# Patient Record
Sex: Male | Born: 1975
Health system: Southern US, Community
[De-identification: ages and names within clinical notes are randomized; demographics above are authoritative.]

## PROBLEM LIST (undated history)

## (undated) DIAGNOSIS — Z9889 Other specified postprocedural states: Secondary | ICD-10-CM

## (undated) DIAGNOSIS — R112 Nausea with vomiting, unspecified: Secondary | ICD-10-CM

## (undated) DIAGNOSIS — R0683 Snoring: Secondary | ICD-10-CM

## (undated) DIAGNOSIS — J329 Chronic sinusitis, unspecified: Secondary | ICD-10-CM

## (undated) DIAGNOSIS — F419 Anxiety disorder, unspecified: Secondary | ICD-10-CM

## (undated) DIAGNOSIS — I10 Essential (primary) hypertension: Secondary | ICD-10-CM

## (undated) DIAGNOSIS — T8859XA Other complications of anesthesia, initial encounter: Secondary | ICD-10-CM

## (undated) HISTORY — DX: Snoring: R06.83

---

## 2003-07-21 ENCOUNTER — Ambulatory Visit (HOSPITAL_COMMUNITY): Admission: RE | Admit: 2003-07-21 | Discharge: 2003-07-21 | Payer: Self-pay | Admitting: Pulmonary Disease

## 2006-02-01 ENCOUNTER — Ambulatory Visit (HOSPITAL_COMMUNITY): Admission: RE | Admit: 2006-02-01 | Discharge: 2006-02-01 | Payer: Self-pay | Admitting: Urology

## 2006-02-11 ENCOUNTER — Emergency Department (HOSPITAL_COMMUNITY): Admission: EM | Admit: 2006-02-11 | Discharge: 2006-02-11 | Payer: Self-pay | Admitting: Emergency Medicine

## 2006-11-06 HISTORY — PX: SHOULDER SURGERY: SHX246

## 2007-07-02 ENCOUNTER — Ambulatory Visit (HOSPITAL_COMMUNITY): Admission: RE | Admit: 2007-07-02 | Discharge: 2007-07-02 | Payer: Self-pay | Admitting: Family Medicine

## 2007-11-29 ENCOUNTER — Ambulatory Visit (HOSPITAL_COMMUNITY): Admission: RE | Admit: 2007-11-29 | Discharge: 2007-11-29 | Payer: Self-pay | Admitting: Specialist

## 2008-01-24 ENCOUNTER — Ambulatory Visit (HOSPITAL_COMMUNITY): Admission: RE | Admit: 2008-01-24 | Discharge: 2008-01-24 | Payer: Self-pay | Admitting: Family Medicine

## 2008-11-02 ENCOUNTER — Ambulatory Visit (HOSPITAL_COMMUNITY): Admission: RE | Admit: 2008-11-02 | Discharge: 2008-11-02 | Payer: Self-pay | Admitting: Family Medicine

## 2009-11-13 ENCOUNTER — Emergency Department (HOSPITAL_COMMUNITY): Admission: EM | Admit: 2009-11-13 | Discharge: 2009-11-13 | Payer: Self-pay | Admitting: Emergency Medicine

## 2011-01-22 LAB — URINE MICROSCOPIC-ADD ON

## 2011-01-22 LAB — URINALYSIS, ROUTINE W REFLEX MICROSCOPIC
Bilirubin Urine: NEGATIVE
Glucose, UA: NEGATIVE mg/dL
Ketones, ur: NEGATIVE mg/dL
pH: 7 (ref 5.0–8.0)

## 2011-09-16 ENCOUNTER — Encounter: Payer: Self-pay | Admitting: *Deleted

## 2011-09-16 ENCOUNTER — Emergency Department (HOSPITAL_COMMUNITY)
Admission: EM | Admit: 2011-09-16 | Discharge: 2011-09-16 | Disposition: A | Discharge status: Home / Self Care | Payer: BC Managed Care – PPO | Attending: Emergency Medicine | Admitting: Emergency Medicine

## 2011-09-16 ENCOUNTER — Emergency Department (HOSPITAL_COMMUNITY): Payer: BC Managed Care – PPO

## 2011-09-16 DIAGNOSIS — IMO0002 Reserved for concepts with insufficient information to code with codable children: Secondary | ICD-10-CM | POA: Insufficient documentation

## 2011-09-16 DIAGNOSIS — M79609 Pain in unspecified limb: Secondary | ICD-10-CM | POA: Insufficient documentation

## 2011-09-16 DIAGNOSIS — W298XXA Contact with other powered powered hand tools and household machinery, initial encounter: Secondary | ICD-10-CM | POA: Insufficient documentation

## 2011-09-16 DIAGNOSIS — R609 Edema, unspecified: Secondary | ICD-10-CM | POA: Insufficient documentation

## 2011-09-16 DIAGNOSIS — S62619B Displaced fracture of proximal phalanx of unspecified finger, initial encounter for open fracture: Secondary | ICD-10-CM

## 2011-09-16 MED ORDER — OXYCODONE-ACETAMINOPHEN 5-325 MG PO TABS: 1.0000 | ORAL_TABLET | Freq: Four times a day (QID) | ORAL | Status: AC | PRN

## 2011-09-16 MED ORDER — CEPHALEXIN 500 MG PO CAPS: 500.0000 mg | ORAL_CAPSULE | Freq: Four times a day (QID) | ORAL | Status: AC

## 2011-09-16 MED ORDER — OXYCODONE-ACETAMINOPHEN 5-325 MG PO TABS
1.0000 | ORAL_TABLET | Freq: Once | ORAL | Status: AC
  Administered 2011-09-16: 1 via ORAL
  Filled 2011-09-16: qty 1

## 2011-09-16 MED ORDER — CEFAZOLIN SODIUM 1 G IJ SOLR
1.0000 g | Freq: Once | INTRAMUSCULAR | Status: AC
  Administered 2011-09-16: 1 g via INTRAMUSCULAR
  Filled 2011-09-16: qty 10

## 2011-09-16 MED ORDER — TETANUS-DIPHTH-ACELL PERTUSSIS 5-2.5-18.5 LF-MCG/0.5 IM SUSP
0.5000 mL | Freq: Once | INTRAMUSCULAR | Status: AC
  Administered 2011-09-16: 0.5 mL via INTRAMUSCULAR
  Filled 2011-09-16: qty 0.5

## 2011-09-16 NOTE — ED Notes (Signed)
No adverse reaction to injection site.

## 2011-09-16 NOTE — ED Provider Notes (Signed)
History     CSN: [id number] Arrival date & time: 09/16/2011 12:35 PM   First MD Initiated Contact with Patient 09/16/11 1315      Chief Complaint  Patient presents with  . Hand Injury    (Consider location/radiation/quality/duration/timing/severity/associated sxs/prior treatment) HPI Pt had a nail gun accidentally fire into his palm around the 5th mcp and exited the proximal phalanx aspect of his small finger on the left at 11am.  Pt has pain in the finger.  Pain increases with movement, it is severe. Ice helps somewhat.  Pt denies numbness or weakness.  No other injuries.   History reviewed. No pertinent past medical history.  Past Surgical History  Procedure Date  . Shoulder surgery     left    History reviewed. No pertinent family history.  History  Substance Use Topics  . Smoking status: Current Everyday Smoker -- 0.5 packs/day    Types: Cigarettes  . Smokeless tobacco: Not on file  . Alcohol Use: Yes     weekly      Review of Systems  All other systems reviewed and are negative.    Allergies  Review of patient's allergies indicates no known allergies.  Home Medications  No current outpatient prescriptions on file.  BP 166/113  Pulse 76  Temp(Src) 98.2 F (36.8 C) (Oral)  Resp 18  Ht 6\' 1"  (1.854 m)  Wt 210 lb (95.255 kg)  BMI 27.71 kg/m2  SpO2 99%  Physical Exam  Nursing note and vitals reviewed. Constitutional: He appears well-developed and well-nourished. No distress.  HENT:  Head: Normocephalic and atraumatic.  Right Ear: External ear normal.  Left Ear: External ear normal.  Eyes: Conjunctivae are normal. Right eye exhibits no discharge. Left eye exhibits no discharge. No scleral icterus.  Neck: Neck supple. No tracheal deviation present.  Cardiovascular: Normal rate.   Pulmonary/Chest: Effort normal. No stridor. No respiratory distress.  Musculoskeletal: He exhibits edema and tenderness.       Hands:      Patient with 2 small puncture  wounds as depicted, pain with range of motion, distal cap refill brisk, limited range of motion but patient is able to flex and extend his small finger  Neurological: He is alert. Cranial nerve deficit: no gross deficits.  Skin: Skin is warm and dry. No rash noted.  Psychiatric: He has a normal mood and affect.    ED Course  Procedures (including critical care time) Pt placed in splint by ED staff.  Tolerated well.   Medications      ceFAZolin (ANCEF) injection 1 g (not administered)  TDaP (BOOSTRIX) injection 0.5 mL (0.5 mL Intramuscular Given 09/16/11 1339)  oxyCODONE-acetaminophen (PERCOCET) 5-325 MG per tablet 1 tablet (1 tablet Oral Given 09/16/11 1330)    Labs Reviewed - No data to display Dg Hand Complete Left  09/16/2011  *RADIOLOGY REPORT*  Clinical Data: Puncture wound to third digit  LEFT HAND - COMPLETE 3+ VIEW  Comparison: None.  Findings: There is a fracture at the base of the proximal phalanx of the fifth digit.  No radiodense foreign body.  IMPRESSION: Fracture at the base of the proximal phalanx of the fifth digit.  Original Report Authenticated By: Genevive Bi, M.D.      MDM  Patient is a fracture at the base this proximal phalanx of the fifth digit. This is technically an open wound as this was caused by a nail gun injury. Patient has been given tetanus and antibiotics. I will start him on  oral antibiotics . I doubt he will require operative intervention at this time is it's a puncture wound however there is the potential for a deep space infection I will consult orthopedics   Diagnosis: Fracture proximal phalanx fifth finger, #2 puncture wound    3:22 PM discussed the case with Dr. Romeo Apple. He wants to see the patient in his office on Monday  Celene Kras, MD 09/16/11 [phone]

## 2011-09-16 NOTE — ED Notes (Signed)
Pt states that he shot a nail through his left hand with a nail gun approximately 45 minutes ago. No bleeding at present.

## 2011-09-19 ENCOUNTER — Encounter: Payer: Self-pay | Admitting: Orthopedic Surgery

## 2011-09-19 ENCOUNTER — Ambulatory Visit (INDEPENDENT_AMBULATORY_CARE_PROVIDER_SITE_OTHER): Payer: BC Managed Care – PPO | Admitting: Orthopedic Surgery

## 2011-09-19 ENCOUNTER — Ambulatory Visit (HOSPITAL_COMMUNITY)
Admission: RE | Admit: 2011-09-19 | Discharge: 2011-09-19 | Disposition: A | Discharge status: Home / Self Care | Payer: BC Managed Care – PPO | Source: Ambulatory Visit | Attending: Orthopedic Surgery | Admitting: Orthopedic Surgery

## 2011-09-19 VITALS — BP 130/90 | Ht 73.0 in | Wt 210.0 lb

## 2011-09-19 DIAGNOSIS — S62619A Displaced fracture of proximal phalanx of unspecified finger, initial encounter for closed fracture: Secondary | ICD-10-CM

## 2011-09-19 DIAGNOSIS — IMO0001 Reserved for inherently not codable concepts without codable children: Secondary | ICD-10-CM | POA: Insufficient documentation

## 2011-09-19 DIAGNOSIS — IMO0002 Reserved for concepts with insufficient information to code with codable children: Secondary | ICD-10-CM | POA: Insufficient documentation

## 2011-09-19 DIAGNOSIS — Z4689 Encounter for fitting and adjustment of other specified devices: Secondary | ICD-10-CM | POA: Insufficient documentation

## 2011-09-19 NOTE — Patient Instructions (Signed)
Finish those antibiotics

## 2011-09-19 NOTE — Progress Notes (Signed)
Occupational Therapy Evaluation  Patient Details  Name: Adrian Lawrence MRN: [id number] Date of Birth: 04-Nov-1976  Today's Date: 09/19/2011 Time: 1600-1630 Time Calculation (min): 30 min OT Eval 10' Splinting 20' Visit#: 1  of 1   Re-eval:    Assessment Diagnosis: S/P Left Prox Phalanx Fracture Next MD Visit: 09/26/11 Prior Therapy: none  Past Medical History: No past medical history on file. Past Surgical History:  Past Surgical History  Procedure Date  . Shoulder surgery     left    Subjective Symptoms/Limitations Symptoms: S:  I shot a nail through my little finger with a nail gun on Saturday. Pain Assessment Currently in Pain?: Yes Pain Score:   4 Pain Location: Hand Pain Orientation: Left Pain Type: Acute pain   Assessment LUE Assessment LUE Assessment:  (PROM is WFL in left ring and small digit)  Exercise/Treatments  SPLINTING:  Fabricated an ulnar gutter splint for left ring and small digits positioning wrist in neutral, MCPJ in 60 flexion, PIPJ and DIPJ in neutral.  Patient was educated on donning and doffing splint, contraindications, wearing scheduled and voiced understanding.  Occupational Therapy Assessment and Plan OT Assessment and Plan Clinical Impression Statement: A:  Patient presents with fx of left proximal phalanx.  Splint fabricated to keep PIPJ and DIPJ in neutral and MCPJ in 60 flexion. Rehab Potential: Excellent OT Frequency: Min 1X/week OT Duration: Other (comment) (1 week) OT Treatment/Interventions: Patient/family education (splinting) OT Plan: One time visit completed this date.  I will place a follow up phone call with patient on 09/22/11 to assess fit and compliance with use of splint.   Goals Short Term Goals Time to Complete Short Term Goals: Other (comment) (1 week) Short Term Goal 1: Patient will be I with wear and care of left hand ulnar gutter splint. End of Session Patient Active Problem List  Diagnoses  . Closed fracture  of middle or proximal phalanx or phalanges of hand   End of Session Activity Tolerance: Patient tolerated treatment well General Behavior During Session: [facility] for tasks performed Cognition: [facility] for tasks performed  Time Calculation Start Time: 1600 Stop Time: 1630 Time Calculation (min): 30 min  Shirlean Mylar, OTR/L  09/19/2011, 5:44 PM

## 2011-09-25 ENCOUNTER — Encounter: Payer: Self-pay | Admitting: Orthopedic Surgery

## 2011-09-25 NOTE — Progress Notes (Signed)
LEFT hand injury  35-year-old male was using a nail gun and the nail went through the volar aspect of his proximal phalanx exiting distal to the joint on November 10.  The pain now is sharp and throbbing 5/10 constant and associated with swelling and bruising he denies numbness tingling.  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  He denies other illnesses or complaints on his review of systems except for his musculoskeletal symptoms.  History reviewed. No pertinent past medical history.  Past Surgical History  Procedure Date  . Shoulder surgery     left    Review of systems joint pain and joint swelling  Exam shows normal appearance and orientation.  Mood and affect are normal.  Ambulation normal.  Examination the LEFT small finger there is tenderness and swelling 2 puncture wounds are noted there is decreased range of motion no joint alignment problems no finger alignment problem clinically normal.  Stability normal.  Strength could not be assessed scan as stated pulse normal color good lymph nodes negative sensation normal.  X-ray shows a fracture proximal phalanx normal alignment no major displacement or angulation  Impression proximal phalanx fracture LEFT hand small finger  Recommended splint from occupational therapy and then early range of motion.  Repeat x-ray

## 2011-09-26 ENCOUNTER — Telehealth (HOSPITAL_COMMUNITY): Payer: Self-pay | Admitting: Specialist

## 2011-09-26 NOTE — Telephone Encounter (Signed)
I contacted Mr. Pickar and left message regarding fit/compliance of the splint that was fabricated for patient last week.

## 2011-09-27 ENCOUNTER — Ambulatory Visit (INDEPENDENT_AMBULATORY_CARE_PROVIDER_SITE_OTHER): Payer: BC Managed Care – PPO | Admitting: Orthopedic Surgery

## 2011-09-27 ENCOUNTER — Encounter: Payer: Self-pay | Admitting: Orthopedic Surgery

## 2011-09-27 DIAGNOSIS — IMO0002 Reserved for concepts with insufficient information to code with codable children: Secondary | ICD-10-CM

## 2011-09-27 NOTE — Progress Notes (Signed)
Scheduled followup for x-rays  Previous history: 35 yo male was using a nail gun and the nail went through the volar aspect of his proximal phalanx exiting distal to the joint on November 10. The pain now is sharp and throbbing 5/10 constant and associated with swelling and bruising he denies numbness tingling. 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 He denies other illnesses or complaints on his review of systems except for his musculoskeletal symptoms.   No problems at this point no changes in the alignment of the finger clinically.  He can bend his finger.  He is wearing a night splint.  Separate x-ray report 3 views LEFT small finger  Clinical I. Ms. Normal on the proximal phalanx of the LEFT small finger.  There is some overlap on the lateral x-ray but the fracture is well visualized.  Impression healing LEFT small finger fracture proximal phalanx  Plan three-week x-ray continue night splint.  Patient cautioned to be very careful

## 2011-09-27 NOTE — Patient Instructions (Signed)
SPLINT AT NIGHT

## 2011-10-03 ENCOUNTER — Telehealth (HOSPITAL_COMMUNITY): Payer: Self-pay | Admitting: Specialist

## 2011-10-03 NOTE — Telephone Encounter (Signed)
Patient called and left message regarding exercises he could do.  I recommended any movement within splint as long as no increased pain or swelling occurred.  He stated the splint is fitting great.  No other questions or concerns.

## 2011-10-18 ENCOUNTER — Ambulatory Visit (INDEPENDENT_AMBULATORY_CARE_PROVIDER_SITE_OTHER): Payer: BC Managed Care – PPO | Admitting: Orthopedic Surgery

## 2011-10-18 ENCOUNTER — Encounter: Payer: Self-pay | Admitting: Orthopedic Surgery

## 2011-10-18 DIAGNOSIS — IMO0002 Reserved for concepts with insufficient information to code with codable children: Secondary | ICD-10-CM

## 2011-10-18 NOTE — Progress Notes (Signed)
  Left small finger proximal phalanx fracture followup doing well scheduled for x-rays x-rays show fracture healing alignment normal clinical alignment normal full range of motion  X-ray were 3 views left hand compared to previous x-ray reason for x-ray fracture The noted fracture has healed in good position alignment is normal  Impression healed left small finger fracture with normal alignment

## 2011-10-18 NOTE — Patient Instructions (Signed)
Normal activity    

## 2011-11-07 HISTORY — PX: LITHOTRIPSY: SUR834

## 2012-03-11 ENCOUNTER — Ambulatory Visit (HOSPITAL_COMMUNITY)
Admission: RE | Admit: 2012-03-11 | Discharge: 2012-03-11 | Disposition: A | Discharge status: Home / Self Care | Payer: BC Managed Care – PPO | Source: Ambulatory Visit | Attending: Urology | Admitting: Urology

## 2012-03-11 ENCOUNTER — Encounter (HOSPITAL_COMMUNITY)
Admission: RE | Disposition: A | Discharge status: Home / Self Care | Payer: Self-pay | Source: Ambulatory Visit | Attending: Urology

## 2012-03-11 ENCOUNTER — Encounter (HOSPITAL_COMMUNITY): Payer: Self-pay

## 2012-03-11 ENCOUNTER — Ambulatory Visit (HOSPITAL_COMMUNITY): Payer: BC Managed Care – PPO

## 2012-03-11 ENCOUNTER — Other Ambulatory Visit: Payer: Self-pay | Admitting: Urology

## 2012-03-11 DIAGNOSIS — Z79899 Other long term (current) drug therapy: Secondary | ICD-10-CM | POA: Insufficient documentation

## 2012-03-11 DIAGNOSIS — I10 Essential (primary) hypertension: Secondary | ICD-10-CM | POA: Insufficient documentation

## 2012-03-11 DIAGNOSIS — N201 Calculus of ureter: Secondary | ICD-10-CM | POA: Insufficient documentation

## 2012-03-11 HISTORY — DX: Essential (primary) hypertension: I10

## 2012-03-11 HISTORY — DX: Anxiety disorder, unspecified: F41.9

## 2012-03-11 SURGERY — LITHOTRIPSY, ESWL
Anesthesia: LOCAL | Laterality: Left

## 2012-03-11 MED ORDER — CIPROFLOXACIN HCL 500 MG PO TABS
ORAL_TABLET | ORAL | Status: AC
  Administered 2012-03-11: 500 mg via ORAL
  Filled 2012-03-11: qty 1

## 2012-03-11 MED ORDER — DIAZEPAM 5 MG PO TABS
10.0000 mg | ORAL_TABLET | ORAL | Status: AC
  Administered 2012-03-11: 10 mg via ORAL

## 2012-03-11 MED ORDER — DIPHENHYDRAMINE HCL 25 MG PO CAPS
ORAL_CAPSULE | ORAL | Status: AC
  Administered 2012-03-11: 25 mg via ORAL
  Filled 2012-03-11: qty 1

## 2012-03-11 MED ORDER — DIPHENHYDRAMINE HCL 25 MG PO CAPS
25.0000 mg | ORAL_CAPSULE | ORAL | Status: AC
  Administered 2012-03-11: 25 mg via ORAL

## 2012-03-11 MED ORDER — OXYCODONE-ACETAMINOPHEN 5-325 MG PO TABS
1.0000 | ORAL_TABLET | Freq: Once | ORAL | Status: AC
  Administered 2012-03-11: 1 via ORAL

## 2012-03-11 MED ORDER — DEXTROSE-NACL 5-0.45 % IV SOLN
INTRAVENOUS | Status: DC
  Administered 2012-03-11: 18:00:00 via INTRAVENOUS

## 2012-03-11 MED ORDER — CIPROFLOXACIN HCL 500 MG PO TABS
500.0000 mg | ORAL_TABLET | ORAL | Status: AC
  Administered 2012-03-11: 500 mg via ORAL

## 2012-03-11 MED ORDER — OXYCODONE-ACETAMINOPHEN 5-325 MG PO TABS
ORAL_TABLET | ORAL | Status: AC
  Administered 2012-03-11: 1 via ORAL
  Filled 2012-03-11: qty 1

## 2012-03-11 MED ORDER — DIAZEPAM 5 MG PO TABS
ORAL_TABLET | ORAL | Status: AC
  Administered 2012-03-11: 10 mg via ORAL
  Filled 2012-03-11: qty 2

## 2012-03-11 NOTE — H&P (Signed)
Reason For Visit  Lt flank pain   Active Problems Problems  1. Flank Pain Left 2. Microscopic Hematuria 599.72 3. Nephrolithiasis 592.0 4. Family history of  Nephrolithiasis  History of Present Illness         36 yo married male, former patient of Dr. Enos Fling (last seen 11/15/09), presents today as a walk-in for Lt flank pain that started this morning, and associated with nausea.  He has a hx of kidney stones.  He states that his urine has been darker in color. Last meal lunch ( 12:30). Sodas: 12 oz 4-6 cans/day.   Past Medical History Problems  1. History of  Gout 274.9 2. History of  Hypertension 401.9 3. History of  Nephrolithiasis V13.01  Surgical History Problems  1. History of  Shoulder Surgery  Current Meds 1. CeleXA 10 MG Oral Tablet; Therapy: (Recorded:03May2013) to 2. Lisinopril 5 MG Oral Tablet; Therapy: (Recorded:03May2013) to  Allergies Medication  1. No Known Drug Allergies  Family History Problems  1. Family history of  Family Health Status Number Of Children 2 DAUGHTERS 2. Paternal history of  Hypertension V17.49 3. Fraternal history of  Hypertension V17.49 4. Paternal history of  Nephrolithiasis 5. Family history of  Nephrolithiasis  Social History Problems  1. Being A Social Drinker 1-2 per day 2. Caffeine Use 3 per day 3. Marital History - Currently Married 4. Occupation: [employer] 5. Tobacco Use 305.1 SMIOKES 1/2 PPD FOR 15 YRS  Review of Systems Genitourinary, constitutional, skin, eye, otolaryngeal, hematologic/lymphatic, cardiovascular, pulmonary, endocrine, musculoskeletal, gastrointestinal, neurological and psychiatric system(s) were reviewed and pertinent findings if present are noted.  Gastrointestinal: nausea, flank pain and abdominal pain.    Vitals Vital Signs [Data Includes: Last 1 Day]  03May2013 03:59PM  BMI Calculated: 27.96 BSA Calculated: 2.2 Height: 6 ft 1 in Weight: 211 lb  Blood Pressure: 139 / 91 Temperature:  97.1 F Heart Rate: 62  Physical Exam Constitutional: Well nourished and well developed . No acute distress.  ENT:. The ears and nose are normal in appearance.  Neck: The appearance of the neck is normal and no neck mass is present.  Pulmonary: No respiratory distress and normal respiratory rhythm and effort.  Cardiovascular: Heart rate and rhythm are normal . No peripheral edema.  Abdomen: The abdomen is soft and nontender. No masses are palpated. mild left CVA tenderness no CVA tenderness. No hernias are palpable. No hepatosplenomegaly noted.  Rectal: Rectal exam demonstrates no tenderness and no masses. The prostate has no nodularity and is not tender. The left seminal vesicle is nonpalpable. The right seminal vesicle is nonpalpable. The perineum is normal on inspection.  Genitourinary: Examination of the penis demonstrates no discharge, no masses, no lesions and a normal meatus. The penis is circumcised. The scrotum is without lesions. The right epididymis is palpably normal and non-tender. The left epididymis is palpably normal and non-tender. The right testis is non-tender and without masses. The left testis is non-tender and without masses.  Lymphatics: The femoral and inguinal nodes are not enlarged or tender.  Skin: Normal skin turgor, no visible rash and no visible skin lesions.  Neuro/Psych:. Mood and affect are appropriate.    Results/Data Urine [Data Includes: Last 1 Day]   03May2013  COLOR YELLOW   APPEARANCE CLEAR   SPECIFIC GRAVITY 1.020   pH 5.5   GLUCOSE NEG mg/dL  BILIRUBIN NEG   KETONE NEG mg/dL  BLOOD MOD   PROTEIN NEG mg/dL  UROBILINOGEN 0.2 mg/dL  NITRITE NEG  LEUKOCYTE ESTERASE NEG   SQUAMOUS EPITHELIAL/HPF RARE   WBC 0-3 WBC/hpf  RBC 11-20 RBC/hpf  BACTERIA NONE SEEN   CRYSTALS Calcium Oxalate crystals noted   CASTS NONE SEEN   Selected Results  AU CT-STONE PROTOCOL 03May2013 12:00AM Jethro Bolus   Test Name Result Flag Reference  ** RADIOLOGY  REPORT BY [facility] ** ORIGINAL APPROVED BY: Natasha Mead, M.D. ON: 03/08/2012 16:42:43   *RADIOLOGY REPORT*  Clinical Data: Left flank pain  CT OF THE ABDOMEN AND PELVIS WITHOUT CONTRAST (CT UROGRAM)  Technique: Multidetector CT imaging was performed through the abdomen and pelvis to include the urinary tract.  Comparison:  None.  Findings: The sagittal images of the spine are unremarkable.  Lung bases are unremarkable.  Unenhanced liver, spleen, pancreas and adrenal glands are unremarkable. Gallbladder is contracted without evidence of calcified gallstones. There is mild left hydronephrosis. Nonobstructive calcified calculus in the lower pole of the left kidney measures 10 by 4.9 mm.  Obstructive calcified calculus is noted in the proximal left ureter at the L4 vertebral body level. This is best visualized in the sagittal image 120 measures 1.4 cm cranial caudally by 6.9 mm.  Mild left perinephric and periureteral stranding. Small accessory splenule is noted. No small bowel obstruction. No pericecal inflammation. Normal appendix is clearly visualized axial image 50. Nonspecific mild thickening of urinary bladder wall. Bilateral distal ureter is unremarkable. Prostate gland and seminal vesicles are unremarkable.  No aortic aneurysm.  IMPRESSION:  1. There is mild left hydronephrosis and left hydroureter. There is a elongated obstructive calculus in proximal left ureter at L4 vertebral body level measures 1.4 cm cranial caudally x 0.7 cm maximum thickness.  2. Nonobstructive calcified calculus lower pole of the left kidney measures 10 by 4.9 mm. 3. No pericecal inflammation. Normal appendix. 4. Unremarkable distal ureters bilaterally.   Assessment Assessed  1. Flank Pain Left 2. Microscopic Hematuria 599.72 3. Nephrolithiasis 592.0 4. Family history of  Nephrolithiasis   L flank pain , microhematuria ,and L renal stone, 8.25mm-1.2cm stone in L upper  ureter. His pain is resolved without  Toradol, and he wants to go home,and have elective lithotripsy Monday. He will RTC ED if he has recurrence of his colic.   Plan FamHx: Nephrolithiasis, Flank Pain Left  1. Oxycodone-Acetaminophen 10-650 MG Oral Tablet; TAKE 1 TABLET EVERY 6 HOURS AS  NEEDED FOR PAIN; Therapy: 03May2013 to (Evaluate:13May2013); Last Rx:03May2013 Nephrolithiasis (592.0), Flank Pain Left, Microscopic Hematuria (599.72)  2. AU CT-STONE PROTOCOL  Done: 03May2013 12:00AM   lithotripsy Monday.  Script for [employer].   Signatures Electronically signed by : Jethro Bolus, M.D.; Mar 08 2012  5:03PM

## 2012-03-11 NOTE — Interval H&P Note (Signed)
History and Physical Interval Note:  03/11/2012 4:21 PM  [name]  has presented today for surgery, with the diagnosis of left ureteral stone  The various methods of treatment have been discussed with the patient and family. After consideration of risks, benefits and other options for treatment, the patient has consented to  Procedure(s) (LRB): EXTRACORPOREAL SHOCK WAVE LITHOTRIPSY (ESWL) (Left) as a surgical intervention .  The patients' history has been reviewed, patient examined, no change in status, stable for surgery.  I have reviewed the patients' chart and labs.  Questions were answered to the patient's satisfaction.     Adrian Lawrence I

## 2012-03-11 NOTE — Discharge Instructions (Signed)
Lithotripsy Care After Refer to this sheet for the next few weeks. These discharge instructions provide you with general information on caring for yourself after you leave the hospital. Your caregiver may also give you specific instructions. Your treatment has been planned according to the most current medical practices available, but unavoidable complications sometimes occur. If you have any problems or questions after discharge, please call your caregiver. AFTER THE PROCEDURE   The recovery time will vary with the procedure done.   You will be taken to the recovery area. A nurse will watch and check your progress. Once you are awake, stable, and taking fluids well, you will be allowed to go home as long as there are no problems.   Your urine may have a red tinge for a few days after treatment. Blood loss is usually minimal.   You may have soreness in the back or flank area. This usually goes away after a few days. The procedure can cause blotches or bruises on the back where the pressure wave enters the skin. These marks usually cause only minimal discomfort and should disappear in a short time.   Stone fragments should begin to pass within 24 hours of treatment. However, a delayed passage is not unusual.   You may have pain, discomfort, and feel sick to your stomach (nauseous) when the crushed (pulverized) fragments of stone are passed down the tube from the kidney to the bladder. Stone fragments can pass soon after the procedure and may last for up to 4 to 8 weeks.   A small number of patients may have severe pain when stone fragments are not able to pass, which leads to an obstruction.   If your stone is greater than 1 inch/2.5 centimeters in diameter or if you have multiple stones that have a combined diameter greater than 1 inch/2.5 centimeters, you may require more than 1 treatment.   You must have someone drive you home.  HOME CARE INSTRUCTIONS   Rest at home until you feel your  energy improving.   Only take over-the-counter or prescription medicines for pain, discomfort, or fever as directed by your caregiver. Depending on the type of lithotripsy, you may need to take medicines (antibiotics) that kill germs and anti-inflammatory medicines for a few days.   Drink enough water and fluids to keep your urine clear or pale yellow. This helps "flush" your kidneys. It helps pass any remaining pieces of stone and prevents stones from coming back.   Most people can resume daily activities within 1 or 2 days after standard lithotripsy. It can take longer to recover from laser and percutaneous lithotripsy.   If the stones are in your urinary system, you may be asked to strain your urine at home to look for stones. Any stones that are found can be sent to a medical lab for examination.   Visit your caregiver for a follow-up appointment in a few weeks. Your doctor may remove your stent if you have one. Your caregiver will also check to see whether stone particles still remain.  SEEK MEDICAL CARE IF:   You have an oral temperature above 102 F (38.9 C).   Your pain is not relieved by medicine.   You have a lasting nauseous feeling.   You feel there is too much blood in the urine.   You develop persistent problems with frequent and/or painful urination that does not at least partially improve after 2 days following the procedure.   You have a congested   cough.   You feel lightheaded.   You develop a rash or any other signs that might suggest an allergic problem.   You develop any reaction or side effects to your medicine(s).  SEEK IMMEDIATE MEDICAL CARE IF:   You experience severe back and/or flank pain.   You see nothing but blood when you urinate.   You cannot pass any urine at all.   You have an oral temperature above 102 F (38.9 C), not controlled by medicine.   You develop shortness of breath, difficulty breathing, or chest pain.   You develop vomiting  that will not stop after 6 to 8 hours.   You have a fainting episode.  MAKE SURE YOU:   Understand these instructions.   Will watch your condition.   Will get help right away if you are not doing well or get worse.  Document Released: 11/12/2007 Document Revised: 10/12/2011 Document Reviewed: 11/12/2007 [employer] Patient Information 2012 [employer]. 

## 2012-03-11 NOTE — Progress Notes (Signed)
Instructed to take laxatives/enemas as per blue folder  No aspirin, advil , or toradol until after litho  Take laxative as instructed in blue folder   

## 2012-08-24 DIAGNOSIS — S0530XA Ocular laceration without prolapse or loss of intraocular tissue, unspecified eye, initial encounter: Secondary | ICD-10-CM | POA: Insufficient documentation

## 2013-02-20 DIAGNOSIS — H5213 Myopia, bilateral: Secondary | ICD-10-CM | POA: Insufficient documentation

## 2014-09-03 ENCOUNTER — Ambulatory Visit (INDEPENDENT_AMBULATORY_CARE_PROVIDER_SITE_OTHER): Payer: BC Managed Care – PPO | Admitting: Otolaryngology

## 2014-09-03 DIAGNOSIS — J310 Chronic rhinitis: Secondary | ICD-10-CM

## 2014-09-03 DIAGNOSIS — J343 Hypertrophy of nasal turbinates: Secondary | ICD-10-CM

## 2014-09-03 DIAGNOSIS — J342 Deviated nasal septum: Secondary | ICD-10-CM

## 2014-09-04 ENCOUNTER — Other Ambulatory Visit (INDEPENDENT_AMBULATORY_CARE_PROVIDER_SITE_OTHER): Payer: Self-pay | Admitting: Otolaryngology

## 2014-09-04 DIAGNOSIS — J329 Chronic sinusitis, unspecified: Secondary | ICD-10-CM

## 2014-09-09 ENCOUNTER — Ambulatory Visit (HOSPITAL_COMMUNITY)
Admission: RE | Admit: 2014-09-09 | Discharge: 2014-09-09 | Disposition: A | Discharge status: Home / Self Care | Payer: BC Managed Care – PPO | Source: Ambulatory Visit | Attending: Otolaryngology | Admitting: Otolaryngology

## 2014-09-09 DIAGNOSIS — J329 Chronic sinusitis, unspecified: Secondary | ICD-10-CM | POA: Insufficient documentation

## 2014-09-09 DIAGNOSIS — J342 Deviated nasal septum: Secondary | ICD-10-CM | POA: Diagnosis not present

## 2014-09-09 DIAGNOSIS — R51 Headache: Secondary | ICD-10-CM | POA: Insufficient documentation

## 2014-09-17 ENCOUNTER — Ambulatory Visit (INDEPENDENT_AMBULATORY_CARE_PROVIDER_SITE_OTHER): Payer: BC Managed Care – PPO | Admitting: Otolaryngology

## 2014-09-17 DIAGNOSIS — J342 Deviated nasal septum: Secondary | ICD-10-CM

## 2014-09-17 DIAGNOSIS — J343 Hypertrophy of nasal turbinates: Secondary | ICD-10-CM

## 2014-09-21 ENCOUNTER — Other Ambulatory Visit: Payer: Self-pay | Admitting: Otolaryngology

## 2014-10-15 ENCOUNTER — Encounter (HOSPITAL_BASED_OUTPATIENT_CLINIC_OR_DEPARTMENT_OTHER): Payer: Self-pay | Admitting: *Deleted

## 2014-10-15 NOTE — Progress Notes (Signed)
To go to [facility] for [id number] 

## 2014-10-15 NOTE — Progress Notes (Signed)
   10/15/14 1519  OBSTRUCTIVE SLEEP APNEA  Have you ever been diagnosed with sleep apnea through a sleep study? No  Do you snore loudly (loud enough to be heard through closed doors)?  1  Do you often feel tired, fatigued, or sleepy during the daytime? 0  Has anyone observed you stop breathing during your sleep? 0  Do you have, or are you being treated for high blood pressure? 1  BMI more than 35 kg/m2? 0  Age over 40 years old? 0  Neck circumference greater than 40 cm/16 inches? 1  Gender: 1  Obstructive Sleep Apnea Score 4  Score 4 or greater  Results sent to PCP

## 2014-10-16 ENCOUNTER — Other Ambulatory Visit: Payer: Self-pay

## 2014-10-16 ENCOUNTER — Encounter (HOSPITAL_COMMUNITY)
Admission: RE | Admit: 2014-10-16 | Discharge: 2014-10-16 | Disposition: A | Discharge status: Home / Self Care | Payer: BC Managed Care – PPO | Source: Ambulatory Visit | Attending: Otolaryngology | Admitting: Otolaryngology

## 2014-10-16 DIAGNOSIS — F419 Anxiety disorder, unspecified: Secondary | ICD-10-CM | POA: Diagnosis not present

## 2014-10-16 DIAGNOSIS — J328 Other chronic sinusitis: Secondary | ICD-10-CM | POA: Diagnosis not present

## 2014-10-16 DIAGNOSIS — I10 Essential (primary) hypertension: Secondary | ICD-10-CM | POA: Diagnosis not present

## 2014-10-16 DIAGNOSIS — J343 Hypertrophy of nasal turbinates: Secondary | ICD-10-CM | POA: Diagnosis not present

## 2014-10-16 DIAGNOSIS — J342 Deviated nasal septum: Secondary | ICD-10-CM | POA: Diagnosis present

## 2014-10-16 LAB — BASIC METABOLIC PANEL
ANION GAP: 15 (ref 5–15)
BUN: 10 mg/dL (ref 6–23)
CHLORIDE: 104 meq/L (ref 96–112)
CO2: 22 mEq/L (ref 19–32)
CREATININE: 0.85 mg/dL (ref 0.50–1.35)
Calcium: 9.6 mg/dL (ref 8.4–10.5)
Glucose, Bld: 94 mg/dL (ref 70–99)
Potassium: 4.3 mEq/L (ref 3.7–5.3)
Sodium: 141 mEq/L (ref 137–147)

## 2014-10-19 ENCOUNTER — Ambulatory Visit (HOSPITAL_BASED_OUTPATIENT_CLINIC_OR_DEPARTMENT_OTHER): Payer: BC Managed Care – PPO | Admitting: Anesthesiology

## 2014-10-19 ENCOUNTER — Encounter (HOSPITAL_BASED_OUTPATIENT_CLINIC_OR_DEPARTMENT_OTHER): Payer: Self-pay | Admitting: *Deleted

## 2014-10-19 ENCOUNTER — Encounter (HOSPITAL_BASED_OUTPATIENT_CLINIC_OR_DEPARTMENT_OTHER)
Admission: RE | Disposition: A | Discharge status: Home / Self Care | Payer: Self-pay | Source: Ambulatory Visit | Attending: Otolaryngology

## 2014-10-19 ENCOUNTER — Ambulatory Visit (HOSPITAL_BASED_OUTPATIENT_CLINIC_OR_DEPARTMENT_OTHER)
Admission: RE | Admit: 2014-10-19 | Discharge: 2014-10-19 | Disposition: A | Discharge status: Home / Self Care | Payer: BC Managed Care – PPO | Source: Ambulatory Visit | Attending: Otolaryngology | Admitting: Otolaryngology

## 2014-10-19 DIAGNOSIS — J342 Deviated nasal septum: Secondary | ICD-10-CM | POA: Diagnosis not present

## 2014-10-19 DIAGNOSIS — J328 Other chronic sinusitis: Secondary | ICD-10-CM | POA: Insufficient documentation

## 2014-10-19 DIAGNOSIS — I10 Essential (primary) hypertension: Secondary | ICD-10-CM | POA: Insufficient documentation

## 2014-10-19 DIAGNOSIS — F419 Anxiety disorder, unspecified: Secondary | ICD-10-CM | POA: Insufficient documentation

## 2014-10-19 DIAGNOSIS — J343 Hypertrophy of nasal turbinates: Secondary | ICD-10-CM | POA: Insufficient documentation

## 2014-10-19 HISTORY — PX: EXCISION CHONCHA BULLOSA: SHX6435

## 2014-10-19 HISTORY — DX: Chronic sinusitis, unspecified: J32.9

## 2014-10-19 HISTORY — PX: NASAL SEPTOPLASTY W/ TURBINOPLASTY: SHX2070

## 2014-10-19 LAB — POCT HEMOGLOBIN-HEMACUE: Hemoglobin: 15.7 g/dL (ref 13.0–17.0)

## 2014-10-19 SURGERY — SEPTOPLASTY, NOSE, WITH NASAL TURBINATE REDUCTION
Anesthesia: General | Site: Nose | Laterality: Bilateral

## 2014-10-19 MED ORDER — ONDANSETRON HCL 4 MG/2ML IJ SOLN
INTRAMUSCULAR | Status: DC | PRN
  Administered 2014-10-19: 4 mg via INTRAVENOUS

## 2014-10-19 MED ORDER — PROMETHAZINE HCL 25 MG/ML IJ SOLN: 6.2500 mg | INTRAMUSCULAR | Status: DC | PRN

## 2014-10-19 MED ORDER — OXYCODONE HCL 5 MG PO TABS
5.0000 mg | ORAL_TABLET | Freq: Once | ORAL | Status: AC | PRN
  Administered 2014-10-19: 5 mg via ORAL

## 2014-10-19 MED ORDER — LIDOCAINE-EPINEPHRINE 1 %-1:100000 IJ SOLN
INTRAMUSCULAR | Status: DC | PRN
  Administered 2014-10-19: 7.5 mL

## 2014-10-19 MED ORDER — OXYMETAZOLINE HCL 0.05 % NA SOLN
NASAL | Status: DC | PRN
  Administered 2014-10-19: 1 via NASAL

## 2014-10-19 MED ORDER — SUCCINYLCHOLINE CHLORIDE 20 MG/ML IJ SOLN
INTRAMUSCULAR | Status: DC | PRN
  Administered 2014-10-19: 100 mg via INTRAVENOUS

## 2014-10-19 MED ORDER — LACTATED RINGERS IV SOLN
INTRAVENOUS | Status: DC
  Administered 2014-10-19 (×2): via INTRAVENOUS

## 2014-10-19 MED ORDER — OXYMETAZOLINE HCL 0.05 % NA SOLN
NASAL | Status: AC
  Filled 2014-10-19: qty 15

## 2014-10-19 MED ORDER — FENTANYL CITRATE 0.05 MG/ML IJ SOLN
INTRAMUSCULAR | Status: DC | PRN
  Administered 2014-10-19: 25 ug via INTRAVENOUS
  Administered 2014-10-19: 50 ug via INTRAVENOUS
  Administered 2014-10-19: 100 ug via INTRAVENOUS
  Administered 2014-10-19: 25 ug via INTRAVENOUS

## 2014-10-19 MED ORDER — PROPOFOL 10 MG/ML IV BOLUS
INTRAVENOUS | Status: AC
  Filled 2014-10-19: qty 20

## 2014-10-19 MED ORDER — EPHEDRINE SULFATE 50 MG/ML IJ SOLN
INTRAMUSCULAR | Status: DC | PRN
  Administered 2014-10-19: 10 mg via INTRAVENOUS

## 2014-10-19 MED ORDER — AMOXICILLIN 875 MG PO TABS: 875.0000 mg | ORAL_TABLET | Freq: Two times a day (BID) | ORAL | Status: DC

## 2014-10-19 MED ORDER — MIDAZOLAM HCL 2 MG/2ML IJ SOLN
INTRAMUSCULAR | Status: AC
  Filled 2014-10-19: qty 2

## 2014-10-19 MED ORDER — MUPIROCIN 2 % EX OINT
TOPICAL_OINTMENT | CUTANEOUS | Status: DC | PRN
  Administered 2014-10-19: 1 via NASAL

## 2014-10-19 MED ORDER — FENTANYL CITRATE 0.05 MG/ML IJ SOLN
INTRAMUSCULAR | Status: AC
  Filled 2014-10-19: qty 6

## 2014-10-19 MED ORDER — CEFAZOLIN SODIUM-DEXTROSE 2-3 GM-% IV SOLR
INTRAVENOUS | Status: DC | PRN
  Administered 2014-10-19: 2 g via INTRAVENOUS

## 2014-10-19 MED ORDER — LIDOCAINE HCL (CARDIAC) 20 MG/ML IV SOLN
INTRAVENOUS | Status: DC | PRN
  Administered 2014-10-19: 100 mg via INTRAVENOUS

## 2014-10-19 MED ORDER — HYDROMORPHONE HCL 1 MG/ML IJ SOLN
0.2500 mg | INTRAMUSCULAR | Status: DC | PRN
  Administered 2014-10-19 (×3): 0.5 mg via INTRAVENOUS

## 2014-10-19 MED ORDER — FENTANYL CITRATE 0.05 MG/ML IJ SOLN
50.0000 ug | INTRAMUSCULAR | Status: DC | PRN
Start: 2014-10-19 — End: 2014-10-19

## 2014-10-19 MED ORDER — LIDOCAINE-EPINEPHRINE 1 %-1:100000 IJ SOLN
INTRAMUSCULAR | Status: AC
  Filled 2014-10-19: qty 1

## 2014-10-19 MED ORDER — OXYCODONE-ACETAMINOPHEN 5-325 MG PO TABS: 1.0000 | ORAL_TABLET | ORAL | Status: DC | PRN

## 2014-10-19 MED ORDER — OXYCODONE HCL 5 MG PO TABS
ORAL_TABLET | ORAL | Status: AC
  Filled 2014-10-19: qty 1

## 2014-10-19 MED ORDER — HYDROMORPHONE HCL 1 MG/ML IJ SOLN
INTRAMUSCULAR | Status: AC
  Filled 2014-10-19: qty 1

## 2014-10-19 MED ORDER — PROPOFOL 10 MG/ML IV BOLUS
INTRAVENOUS | Status: DC | PRN
  Administered 2014-10-19: 200 mg via INTRAVENOUS

## 2014-10-19 MED ORDER — MUPIROCIN 2 % EX OINT
TOPICAL_OINTMENT | CUTANEOUS | Status: AC
  Filled 2014-10-19: qty 22

## 2014-10-19 MED ORDER — DEXAMETHASONE SODIUM PHOSPHATE 4 MG/ML IJ SOLN
INTRAMUSCULAR | Status: DC | PRN
  Administered 2014-10-19: 10 mg via INTRAVENOUS

## 2014-10-19 MED ORDER — OXYCODONE HCL 5 MG/5ML PO SOLN: 5.0000 mg | Freq: Once | ORAL | Status: AC | PRN

## 2014-10-19 MED ORDER — MIDAZOLAM HCL 2 MG/2ML IJ SOLN
1.0000 mg | INTRAMUSCULAR | Status: DC | PRN
  Administered 2014-10-19: 2 mg via INTRAVENOUS

## 2014-10-19 SURGICAL SUPPLY — 36 items
ATTRACTOMAT 16X20 MAGNETIC DRP (DRAPES) IMPLANT
CANISTER SUCT 1200ML W/VALVE (MISCELLANEOUS) ×3 IMPLANT
COAGULATOR SUCT 6 FR SWTCH (ELECTROSURGICAL)
COAGULATOR SUCT 8FR VV (MISCELLANEOUS) ×3 IMPLANT
COAGULATOR SUCT SWTCH 10FR 6 (ELECTROSURGICAL) IMPLANT
DECANTER SPIKE VIAL GLASS SM (MISCELLANEOUS) IMPLANT
DRSG NASOPORE 8CM (GAUZE/BANDAGES/DRESSINGS) IMPLANT
DRSG TELFA 3X8 NADH (GAUZE/BANDAGES/DRESSINGS) IMPLANT
ELECT REM PT RETURN 9FT ADLT (ELECTROSURGICAL) ×3
ELECTRODE REM PT RTRN 9FT ADLT (ELECTROSURGICAL) ×1 IMPLANT
GLOVE BIO SURGEON STRL SZ7.5 (GLOVE) ×3 IMPLANT
GLOVE SURG SS PI 7.0 STRL IVOR (GLOVE) ×2 IMPLANT
GOWN STRL REUS W/ TWL LRG LVL3 (GOWN DISPOSABLE) ×2 IMPLANT
GOWN STRL REUS W/TWL LRG LVL3 (GOWN DISPOSABLE) ×6
HEMOSTAT SURGICEL 2X14 (HEMOSTASIS) IMPLANT
NDL HYPO 25X1 1.5 SAFETY (NEEDLE) ×1 IMPLANT
NEEDLE HYPO 25X1 1.5 SAFETY (NEEDLE) ×3 IMPLANT
NS IRRIG 1000ML POUR BTL (IV SOLUTION) ×3 IMPLANT
PACK BASIN DAY SURGERY FS (CUSTOM PROCEDURE TRAY) ×3 IMPLANT
PACK ENT DAY SURGERY (CUSTOM PROCEDURE TRAY) ×3 IMPLANT
PAD DRESSING TELFA 3X8 NADH (GAUZE/BANDAGES/DRESSINGS) IMPLANT
SLEEVE SCD COMPRESS KNEE MED (MISCELLANEOUS) ×2 IMPLANT
SOLUTION BUTLER CLEAR DIP (MISCELLANEOUS) ×4 IMPLANT
SPLINT NASAL AIRWAY SILICONE (MISCELLANEOUS) ×2 IMPLANT
SPONGE GAUZE 2X2 8PLY STER LF (GAUZE/BANDAGES/DRESSINGS) ×1
SPONGE GAUZE 2X2 8PLY STRL LF (GAUZE/BANDAGES/DRESSINGS) ×2 IMPLANT
SPONGE NEURO XRAY DETECT 1X3 (DISPOSABLE) ×3 IMPLANT
SUT CHROMIC 4 0 P 3 18 (SUTURE) ×3 IMPLANT
SUT PLAIN 4 0 ~~LOC~~ 1 (SUTURE) ×3 IMPLANT
SUT PROLENE 3 0 PS 2 (SUTURE) ×3 IMPLANT
SUT VIC AB 4-0 P-3 18XBRD (SUTURE) IMPLANT
SUT VIC AB 4-0 P3 18 (SUTURE)
TOWEL OR 17X24 6PK STRL BLUE (TOWEL DISPOSABLE) ×3 IMPLANT
TUBE SALEM SUMP 12R W/ARV (TUBING) IMPLANT
TUBE SALEM SUMP 16 FR W/ARV (TUBING) ×3 IMPLANT
YANKAUER SUCT BULB TIP NO VENT (SUCTIONS) ×3 IMPLANT

## 2014-10-19 NOTE — Anesthesia Postprocedure Evaluation (Signed)
Anesthesia Post Note  Patient: Adrian Lawrence  Procedure(s) Performed: Procedure(s) (LRB): NASAL SEPTOPLASTY WITH TURBINATE REDUCTION (Bilateral) EXCISION CHONCHA BULLOSA (Bilateral)  Anesthesia type: general  Patient location: PACU  Post pain: Pain level controlled  Post assessment: Patient's Cardiovascular Status Stable  Last Vitals:  Filed Vitals:   10/19/14 1100  BP: 170/104  Pulse: 89  Temp:   Resp: 17    Post vital signs: Reviewed and stable  Level of consciousness: sedated  Complications: No apparent anesthesia complications

## 2014-10-19 NOTE — Anesthesia Preprocedure Evaluation (Signed)
Anesthesia Evaluation  Patient identified by MRN, date of birth, ID band Patient awake    Reviewed: Allergy & Precautions, H&P , NPO status , Patient's Chart, lab work & pertinent test results  Airway Mallampati: II  TM Distance: >3 FB Neck ROM: full    Dental  (+) Teeth Intact, Dental Advidsory Given   Pulmonary Current Smoker,  breath sounds clear to auscultation        Cardiovascular hypertension, Rhythm:regular Rate:Normal     Neuro/Psych negative neurological ROS  negative psych ROS   GI/Hepatic negative GI ROS, Neg liver ROS,   Endo/Other  negative endocrine ROS  Renal/GU negative Renal ROS     Musculoskeletal   Abdominal   Peds  Hematology   Anesthesia Other Findings   Reproductive/Obstetrics negative OB ROS                             Anesthesia Physical Anesthesia Plan  ASA: II  Anesthesia Plan: General ETT   Post-op Pain Management:    Induction:   Airway Management Planned:   Additional Equipment:   Intra-op Plan:   Post-operative Plan:   Informed Consent: I have reviewed the patients History and Physical, chart, labs and discussed the procedure including the risks, benefits and alternatives for the proposed anesthesia with the patient or authorized representative who has indicated his/her understanding and acceptance.   Dental Advisory Given  Plan Discussed with: Anesthesiologist, CRNA and Surgeon  Anesthesia Plan Comments:         Anesthesia Quick Evaluation

## 2014-10-19 NOTE — Op Note (Signed)
DATE OF PROCEDURE: 10/19/2014  OPERATIVE REPORT   SURGEON: Leta Baptist, MD   PREOPERATIVE DIAGNOSES:  1. Severe nasal septal deviation.  2. Bilateral inferior turbinate hypertrophy.  3. Chronic nasal obstruction. 4. Bilateral concha bullosa.  POSTOPERATIVE DIAGNOSES:  1. Severe nasal septal deviation.  2. Bilateral inferior turbinate hypertrophy.  3. Chronic nasal obstruction. 4. Bilateral concha bullosa.  PROCEDURE PERFORMED:  1. Septoplasty.  2. Bilateral partial inferior turbinate resection.  3. Bilateral endoscopic concha bullosa.  ANESTHESIA: General endotracheal tube anesthesia.   COMPLICATIONS: None.   ESTIMATED BLOOD LOSS: Less than 50 mL.   INDICATION FOR PROCEDURE: Adrian Lawrence is a 38 y.o. male with a history of chronic nasal obstruction and facial pressure. The patient was treated with antihistamine, decongestant, steroid nasal spray, and systemic steroids. However, the patient continues to be symptomatic. On examination, the patient was noted to have bilateral severe inferior turbinate hypertrophy and significant nasal septal deviation, causing significant nasal obstruction. He is also noted to have bilateral concha bullosa, causing further obstruction of his nasal passageways. Based on the above findings, the decision was made for the patient to undergo the above-stated procedure. The risks, benefits, alternatives, and details of the procedure were discussed with the patient. Questions were invited and answered. Informed consent was obtained.   DESCRIPTION OF PROCEDURE: The patient was taken to the operating room and placed supine on the operating table. General endotracheal tube anesthesia was administered by the anesthesiologist. The patient was positioned, and prepped and draped in the standard fashion for nasal surgery. Pledgets soaked with Afrin were placed in both nasal cavities for decongestion. The pledgets were subsequently removed. The above mentioned severe septal  deviation was again noted. 1% lidocaine with 1:100,000 epinephrine was injected onto the nasal septum bilaterally. A hemitransfixion incision was made on the left side. The mucosal flap was carefully elevated on the left side. A cartilaginous incision was made 1 cm superior to the caudal margin of the nasal septum. Mucosal flap was also elevated on the right side in the similar fashion. It should be noted that due to the severe septal deviation, the deviated portion of the cartilaginous and bony septum had to be removed in piecemeal fashion. Once the deviated portions were removed, a straight midline septum was achieved. The septum was then quilted with 4-0 plain gut sutures. The hemitransfixion incision was closed with interrupted 4-0 chromic sutures.   Attention was then focused on the concha bullosa. Using a 0 degree endoscope, the lateral half of each concha bullosa was resected with a sickle knife and truecut forceps. The inferior one half of both hypertrophied inferior turbinate was then crossclamped with a Kelly clamp. The inferior one half of each inferior turbinate was then resected with a pair of cross cutting scissors. Hemostasis was achieved with a suction cautery device.   The care of the patient was turned over to the anesthesiologist. The patient was awakened from anesthesia without difficulty. The patient was extubated and transferred to the recovery room in good condition.   OPERATIVE FINDINGS: Severe nasal septal deviation and bilateral inferior turbinate hypertrophy. Bilateral concha bullosa.  SPECIMEN: None.   FOLLOWUP CARE: The patient be discharged home once he is awake and alert. The patient will be placed on Percocet 1-2 tablets p.o. q.6 hours p.r.n. pain, and amoxicillin 875 mg p.o. b.i.d. for 5 days. The patient will follow up in my office in approximately 1 week for splint removal.   Adrian Mesta Raynelle Bring, MD

## 2014-10-19 NOTE — Anesthesia Procedure Notes (Signed)
Procedure Name: Intubation Date/Time: 10/19/2014 8:08 AM Performed by: Maryella Shivers Pre-anesthesia Checklist: Patient identified, Emergency Drugs available, Suction available and Patient being monitored Patient Re-evaluated:Patient Re-evaluated prior to inductionOxygen Delivery Method: Circle System Utilized Preoxygenation: Pre-oxygenation with 100% oxygen Intubation Type: IV induction Ventilation: Mask ventilation without difficulty Laryngoscope Size: Mac and 3 Grade View: Grade III Tube type: Oral Tube size: 8.0 mm Number of attempts: 1 Airway Equipment and Method: stylet and oral airway Placement Confirmation: ETT inserted through vocal cords under direct vision,  positive ETCO2 and breath sounds checked- equal and bilateral Secured at: 24 cm Tube secured with: Tape Dental Injury: Teeth and Oropharynx as per pre-operative assessment  Comments: Large epiglottis

## 2014-10-19 NOTE — H&P (Signed)
Cc: Chronic nasal obstruction  HPI: The patient is a 38 y/o male who returns today for follow up evaluation of recurrent sinus pressure. He was last seen on 09/03/2014.  At that time, he was noted to have a left septal deviation with bilateral turbinate hypertrophy without evidence of acute sinusitis.  The patient was placed on daily steroid nasal spray and sinus CT was obtained. The CT shows no evidence of chronic or acute sinus disease. The patient is noted to have a left septal deviation and spur with bilateral inferior turbinate hypertrophy and chonco bullosa. The patient reports some improvement in his symptoms with the daily use of the steroid nasal spray. He still notes intermittent occipital head discomfort.  He currently denies any facial pain or pressure. The patient is noted to snore loudly with witnessed apnea episodes by his family. No other ENT, GI, or respiratory issue noted since the last visit.   Exam: General: Communicates without difficulty, well nourished, no acute distress. Head: Normocephalic, no evidence injury, no tenderness, facial buttresses intact without stepoff. Eyes: PERRL, EOMI. No scleral icterus, conjunctivae clear. Neuro: CN II exam reveals vision grossly intact. No nystagmus at any point of gaze. Ears: Auricles well formed without lesions. Ear canals are intact without mass or lesion. No erythema or edema is appreciated. The TMs are intact without fluid. Nose: External evaluation reveals normal support and skin without lesions. Dorsum is intact. Anterior rhinoscopy reveals congested and edematous mucosa over anterior aspect of the inferior turbinates and nasal septum. No purulence is noted. Left NSD with spur. Oral:  Oral cavity and oropharynx are intact, symmetric, without erythema or edema. Mucosa is moist without lesions. Neck: Full range of motion without pain. There is no significant lymphadenopathy. No masses palpable. Thyroid bed within normal limits to palpation.  Parotid glands and submandibular glands equal bilaterally without mass. Trachea is midline. Neuro:  CN 2-12 grossly intact. Gait normal. Vestibular: No nystagmus at any point of gaze.   Assessment 1.  Left septal deviation with bilateral Inferior turbinate hypertrophy and chonco bullosa.   2.  CT shows no evidence of acute or chronic sinus disease.   Plan  1.  The physical exam and CT findings are discussed with the patient. 2.  Treatment options include continuing medical management versus surgical intervention. The patient will likely benefit from having a septoplasty with bilateral turbinate reduction and chonco bullosa resection.  The risks, benefits, alternatives, and details of the procedure are reviewed with the patient. Questions are invited and answered. 3.  The patient would like proceed with surgical intervention. The procedure will be scheduled in accordance to the patient's schedule.

## 2014-10-19 NOTE — Discharge Instructions (Addendum)

## 2014-10-19 NOTE — Transfer of Care (Signed)
Immediate Anesthesia Transfer of Care Note  Patient: Adrian Lawrence  Procedure(s) Performed: Procedure(s): NASAL SEPTOPLASTY WITH TURBINATE REDUCTION (Bilateral) EXCISION CHONCHA BULLOSA (Bilateral)  Patient Location: PACU  Anesthesia Type:General  Level of Consciousness: awake, alert  and oriented  Airway & Oxygen Therapy: Patient Spontanous Breathing and Patient connected to face mask oxygen  Post-op Assessment: Report given to PACU RN and Post -op Vital signs reviewed and stable  Post vital signs: Reviewed and stable  Complications: No apparent anesthesia complications

## 2014-10-20 ENCOUNTER — Encounter (HOSPITAL_BASED_OUTPATIENT_CLINIC_OR_DEPARTMENT_OTHER): Payer: Self-pay | Admitting: Otolaryngology

## 2014-10-22 ENCOUNTER — Ambulatory Visit (INDEPENDENT_AMBULATORY_CARE_PROVIDER_SITE_OTHER): Payer: BC Managed Care – PPO | Admitting: Otolaryngology

## 2014-11-12 ENCOUNTER — Ambulatory Visit (INDEPENDENT_AMBULATORY_CARE_PROVIDER_SITE_OTHER): Payer: 59 | Admitting: Otolaryngology

## 2015-02-11 ENCOUNTER — Ambulatory Visit (INDEPENDENT_AMBULATORY_CARE_PROVIDER_SITE_OTHER): Payer: 59 | Admitting: Otolaryngology

## 2015-08-05 ENCOUNTER — Encounter: Payer: Self-pay | Admitting: Neurology

## 2015-08-05 ENCOUNTER — Ambulatory Visit (INDEPENDENT_AMBULATORY_CARE_PROVIDER_SITE_OTHER): Payer: 59 | Admitting: Neurology

## 2015-08-05 VITALS — BP 150/100 | HR 68 | Resp 20 | Ht 74.0 in | Wt 220.0 lb

## 2015-08-05 DIAGNOSIS — Q674 Other congenital deformities of skull, face and jaw: Secondary | ICD-10-CM | POA: Insufficient documentation

## 2015-08-05 DIAGNOSIS — R0981 Nasal congestion: Secondary | ICD-10-CM | POA: Insufficient documentation

## 2015-08-05 DIAGNOSIS — G4730 Sleep apnea, unspecified: Secondary | ICD-10-CM

## 2015-08-05 DIAGNOSIS — G4710 Hypersomnia, unspecified: Secondary | ICD-10-CM

## 2015-08-05 NOTE — Patient Instructions (Signed)
Polysomnography (Sleep Studies) Polysomnography (PSG) is a series of tests used for detecting (diagnosing) obstructive sleep apnea and other sleep disorders. The tests measure how some parts of your body are working while you are sleeping. The tests are extensive and expensive. They are done in a sleep lab or hospital, and vary from center to center. Your caregiver may perform other more simple sleep studies and questionnaires before doing more complete and involved testing. Testing may not be covered by insurance. Some of these tests are:  An EEG (Electroencephalogram). This tests your brain waves and stages of sleep.  An EOG (Electrooculogram). This measures the movements of your eyes. It detects periods of REM (rapid eye movement) sleep, which is your dream sleep.  An EKG (Electrocardiogram). This measures your heart rhythm.  EMG (Electromyography). This is a measurement of how the muscles are working in your upper airway and your legs while sleeping.  An oximetry measurement. It measures how much oxygen (air) you are getting while sleeping.  Breathing efforts may be measured. The same test can be interpreted (understood) differently by different caregivers and centers that study sleep.  Studies may be given an apnea/hypopnea index (AHI). This is a number which is found by counting the times of no breathing or under breathing during the night, and relating those numbers to the amount of time spent in bed. When the AHI is greater than 15, the patient is likely to complain of daytime sleepiness. When the AHI is greater than 30, the patient is at increased risk for heart problems and must be followed more closely. Following the AHI also allows you to know how treatment is working. Simple oximetry (tracking the amount of oxygen that is taken in) can be used for screening patients who:  Do not have symptoms (problems) of OSA.  Have a normal Epworth Sleepiness Scale Score.  Have a low pre-test  probability of having OSA.  Have none of the upper airway problems likely to cause apnea.  Oximetry is also used to determine if treatment is effective in patients who showed significant desaturations (not getting enough oxygen) on their home sleep study. One extra measure of safety is to perform additional studies for the person who only snores. This is because no one can predict with absolute certainty who will have OSA. Those who show significant desaturations (not getting enough oxygen) are recommended to have a more detailed sleep study. Document Released: 04/29/2003 Document Revised: 01/15/2012 Document Reviewed: 12/29/2013 ExitCare Patient Information 2015 [employer]. This information is not intended to replace advice given to you by your health care provider. Make sure you discuss any questions you have with your health care provider.  
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
## 2015-08-19 ENCOUNTER — Encounter (INDEPENDENT_AMBULATORY_CARE_PROVIDER_SITE_OTHER): Payer: 59

## 2015-08-19 DIAGNOSIS — Q674 Other congenital deformities of skull, face and jaw: Secondary | ICD-10-CM

## 2015-08-19 DIAGNOSIS — R0981 Nasal congestion: Secondary | ICD-10-CM

## 2015-08-19 DIAGNOSIS — G4710 Hypersomnia, unspecified: Secondary | ICD-10-CM

## 2015-08-19 DIAGNOSIS — G4730 Sleep apnea, unspecified: Principal | ICD-10-CM

## 2015-08-31 ENCOUNTER — Telehealth: Payer: Self-pay

## 2015-08-31 DIAGNOSIS — G4733 Obstructive sleep apnea (adult) (pediatric): Secondary | ICD-10-CM

## 2015-08-31 NOTE — Telephone Encounter (Signed)
Spoke to pt. I advised him that his HST showed mild osa and treatment is advised. His HST does not reveal enough evidence for significant central or obstructive sleep apnea to warrant cpap intervention. I advised him to consider alternative therapies such as an oral appliance or ENT evaluation. Pt states that his wife works as a Copywriter, advertising for Dr. Mariana Arn and he is sure that Dr. Lovena Le makes the oral appliances for osa. He wishes for a referral to Dr. Mariana Arn for the oral appliance. I advised pt that if he found out that Dr. Lovena Le does NOT make the oral appliances, to let us know, and we will send him to someone that does. I offered a f/u appt for the pt but he said he would call and make it another time.

## 2015-08-31 NOTE — Telephone Encounter (Signed)
Called pt to give sleep study results. No answer, left a message asking him to call me back.

## 2015-09-01 NOTE — Telephone Encounter (Signed)
Noted this has been sent  °

## 2016-08-16 DIAGNOSIS — R52 Pain, unspecified: Secondary | ICD-10-CM | POA: Insufficient documentation

## 2016-08-16 DIAGNOSIS — M47812 Spondylosis without myelopathy or radiculopathy, cervical region: Secondary | ICD-10-CM | POA: Insufficient documentation

## 2016-08-26 ENCOUNTER — Emergency Department (HOSPITAL_COMMUNITY): Payer: 59

## 2016-08-26 ENCOUNTER — Emergency Department (HOSPITAL_COMMUNITY)
Admission: EM | Admit: 2016-08-26 | Discharge: 2016-08-26 | Disposition: A | Discharge status: Home / Self Care | Payer: 59 | Attending: Emergency Medicine | Admitting: Emergency Medicine

## 2016-08-26 ENCOUNTER — Encounter (HOSPITAL_COMMUNITY): Payer: Self-pay | Admitting: Emergency Medicine

## 2016-08-26 DIAGNOSIS — Y939 Activity, unspecified: Secondary | ICD-10-CM | POA: Insufficient documentation

## 2016-08-26 DIAGNOSIS — F17210 Nicotine dependence, cigarettes, uncomplicated: Secondary | ICD-10-CM | POA: Diagnosis not present

## 2016-08-26 DIAGNOSIS — Y929 Unspecified place or not applicable: Secondary | ICD-10-CM | POA: Diagnosis not present

## 2016-08-26 DIAGNOSIS — Z79899 Other long term (current) drug therapy: Secondary | ICD-10-CM | POA: Insufficient documentation

## 2016-08-26 DIAGNOSIS — S0003XA Contusion of scalp, initial encounter: Secondary | ICD-10-CM | POA: Insufficient documentation

## 2016-08-26 DIAGNOSIS — I10 Essential (primary) hypertension: Secondary | ICD-10-CM | POA: Insufficient documentation

## 2016-08-26 DIAGNOSIS — Y999 Unspecified external cause status: Secondary | ICD-10-CM | POA: Diagnosis not present

## 2016-08-26 DIAGNOSIS — S0990XA Unspecified injury of head, initial encounter: Secondary | ICD-10-CM | POA: Diagnosis present

## 2016-08-26 MED ORDER — IBUPROFEN 800 MG PO TABS
800.0000 mg | ORAL_TABLET | Freq: Once | ORAL | Status: AC
  Administered 2016-08-26: 800 mg via ORAL
  Filled 2016-08-26: qty 1
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
## 2016-08-26 NOTE — Discharge Instructions (Signed)
Take acetaminophen, ibuprofen, or naproxen as needed for pain.

## 2016-08-31 ENCOUNTER — Ambulatory Visit (INDEPENDENT_AMBULATORY_CARE_PROVIDER_SITE_OTHER): Payer: 59 | Admitting: Otolaryngology

## 2016-08-31 DIAGNOSIS — H9222 Otorrhagia, left ear: Secondary | ICD-10-CM

## 2016-08-31 DIAGNOSIS — H9042 Sensorineural hearing loss, unilateral, left ear, with unrestricted hearing on the contralateral side: Secondary | ICD-10-CM | POA: Diagnosis not present

## 2016-09-07 ENCOUNTER — Ambulatory Visit (INDEPENDENT_AMBULATORY_CARE_PROVIDER_SITE_OTHER): Payer: 59 | Admitting: Otolaryngology

## 2016-09-07 DIAGNOSIS — H9042 Sensorineural hearing loss, unilateral, left ear, with unrestricted hearing on the contralateral side: Secondary | ICD-10-CM | POA: Diagnosis not present

## 2017-05-10 DIAGNOSIS — H0014 Chalazion left upper eyelid: Secondary | ICD-10-CM | POA: Diagnosis not present

## 2017-05-10 DIAGNOSIS — H17821 Peripheral opacity of cornea, right eye: Secondary | ICD-10-CM | POA: Diagnosis not present

## 2017-08-07 DIAGNOSIS — Z0000 Encounter for general adult medical examination without abnormal findings: Secondary | ICD-10-CM | POA: Diagnosis not present

## 2017-08-07 DIAGNOSIS — Z719 Counseling, unspecified: Secondary | ICD-10-CM | POA: Diagnosis not present

## 2017-08-08 DIAGNOSIS — Z1389 Encounter for screening for other disorder: Secondary | ICD-10-CM | POA: Diagnosis not present

## 2017-08-08 DIAGNOSIS — Z0000 Encounter for general adult medical examination without abnormal findings: Secondary | ICD-10-CM | POA: Diagnosis not present

## 2017-11-19 DIAGNOSIS — D2261 Melanocytic nevi of right upper limb, including shoulder: Secondary | ICD-10-CM | POA: Diagnosis not present

## 2017-11-19 DIAGNOSIS — L821 Other seborrheic keratosis: Secondary | ICD-10-CM | POA: Diagnosis not present

## 2017-11-19 DIAGNOSIS — D2262 Melanocytic nevi of left upper limb, including shoulder: Secondary | ICD-10-CM | POA: Diagnosis not present

## 2018-06-19 DIAGNOSIS — Z0000 Encounter for general adult medical examination without abnormal findings: Secondary | ICD-10-CM | POA: Diagnosis not present

## 2018-06-19 DIAGNOSIS — I10 Essential (primary) hypertension: Secondary | ICD-10-CM | POA: Diagnosis not present

## 2018-06-20 DIAGNOSIS — Z1389 Encounter for screening for other disorder: Secondary | ICD-10-CM | POA: Diagnosis not present

## 2018-06-20 DIAGNOSIS — Z0000 Encounter for general adult medical examination without abnormal findings: Secondary | ICD-10-CM | POA: Diagnosis not present

## 2018-12-24 DIAGNOSIS — D2261 Melanocytic nevi of right upper limb, including shoulder: Secondary | ICD-10-CM | POA: Diagnosis not present

## 2018-12-24 DIAGNOSIS — D2272 Melanocytic nevi of left lower limb, including hip: Secondary | ICD-10-CM | POA: Diagnosis not present

## 2018-12-24 DIAGNOSIS — D2371 Other benign neoplasm of skin of right lower limb, including hip: Secondary | ICD-10-CM | POA: Diagnosis not present

## 2020-05-28 DIAGNOSIS — I10 Essential (primary) hypertension: Secondary | ICD-10-CM | POA: Insufficient documentation

## 2020-05-28 DIAGNOSIS — E785 Hyperlipidemia, unspecified: Secondary | ICD-10-CM | POA: Insufficient documentation

## 2020-08-16 ENCOUNTER — Other Ambulatory Visit: Payer: Self-pay | Admitting: Orthopaedic Surgery

## 2020-08-16 ENCOUNTER — Ambulatory Visit: Payer: 59

## 2020-08-16 ENCOUNTER — Encounter: Payer: Self-pay | Admitting: Orthopaedic Surgery

## 2020-08-16 ENCOUNTER — Telehealth: Payer: Self-pay | Admitting: Orthopaedic Surgery

## 2020-08-16 ENCOUNTER — Other Ambulatory Visit: Payer: Self-pay

## 2020-08-16 ENCOUNTER — Ambulatory Visit (INDEPENDENT_AMBULATORY_CARE_PROVIDER_SITE_OTHER): Payer: 59 | Admitting: Orthopaedic Surgery

## 2020-08-16 VITALS — BP 173/115 | HR 81 | Ht 73.0 in | Wt 232.0 lb

## 2020-08-16 DIAGNOSIS — M25521 Pain in right elbow: Secondary | ICD-10-CM | POA: Diagnosis not present

## 2020-08-16 DIAGNOSIS — M7021 Olecranon bursitis, right elbow: Secondary | ICD-10-CM

## 2020-08-16 NOTE — Progress Notes (Signed)
Subjective:    Patient ID: Adrian Lawrence, male    DOB: 12-11-1975, 44 y.o.   MRN: [id number]  HPI He hit his right elbow about two to three weeks ago.  He has had swelling and pain.   The swelling will not go away.  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  He has no redness, no numbness.  He has tried ice/heat and Advil.  The swelling is still there.   Review of Systems  Constitutional: Positive for activity change.  Musculoskeletal: Positive for arthralgias and joint swelling.  All other systems reviewed and are negative.  For Review of Systems, all other systems reviewed and are negative.  The following is a summary of the past history medically, past history surgically, known current medicines, social history and family history.  This information is gathered electronically by the computer from prior information and documentation.  I review this each visit and have found including this information at this point in the chart is beneficial and informative.   Past Medical History:  Diagnosis Date  . Anxiety    celexa helps chill out a little  . Chronic sinusitis   . Hypertension   . Snoring     Past Surgical History:  Procedure Laterality Date  . EXCISION CHONCHA BULLOSA Bilateral 10/19/2014   Procedure: EXCISION CHONCHA BULLOSA;  Surgeon: Ascencion Dike, MD;  Location: [place];  Service: ENT;  Laterality: Bilateral;  . LITHOTRIPSY  2013  . NASAL SEPTOPLASTY W/ TURBINOPLASTY Bilateral 10/19/2014   Procedure: NASAL SEPTOPLASTY WITH TURBINATE REDUCTION;  Surgeon: Ascencion Dike, MD;  Location: [place];  Service: ENT;  Laterality: Bilateral;  . SHOULDER SURGERY  2008   left    Current Outpatient Medications on File Prior to Visit  Medication Sig Dispense Refill  . cetirizine (ZYRTEC) 10 MG tablet Take 10 mg by mouth daily.    . citalopram (CELEXA) 20 MG tablet Take 20 mg by mouth daily.      Marland Kitchen losartan (COZAAR) 100 MG tablet Take 100 mg  by mouth daily.    . mometasone (NASONEX) 50 MCG/ACT nasal spray Place 2 sprays into the nose daily.    Marland Kitchen olmesartan (BENICAR) 40 MG tablet Take 40 mg by mouth daily.    Marland Kitchen omeprazole (PRILOSEC) 40 MG capsule Take 40 mg by mouth daily.     No current facility-administered medications on file prior to visit.    Social History   Socioeconomic History  . Marital status: Married    Spouse name: Not on file  . Number of children: Not on file  . Years of education: Not on file  . Highest education level: Not on file  Occupational History  . 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 Smoking status: Current Every Day Smoker    Packs/day: 0.50    Years: 15.00    Pack years: 7.50    Types: Cigarettes  . Smokeless tobacco: Current User    Types: Chew  Substance and Sexual Activity  . Alcohol use: Yes    Alcohol/week: 47.0 standard drinks    Types: 12 Standard drinks or equivalent, 35 Cans of beer per week    Comment: 6/day  . Drug use: No  . Sexual activity: Yes  Other Topics Concern  . Not on file  Social History Narrative   Drinks 2 cups of caffeine during day.   Social Determinants of Health   Financial Resource Strain:   .  Difficulty of Paying Living Expenses: Not on file  Food Insecurity:   . Worried About Charity fundraiser in the Last Year: Not on file  . Ran Out of Food in the Last Year: Not on file  Transportation Needs:   . Lack of Transportation (Medical): Not on file  . Lack of Transportation (Non-Medical): Not on file  Physical Activity:   . Days of Exercise per Week: Not on file  . Minutes of Exercise per Session: Not on file  Stress:   . Feeling of Stress : Not on file  Social Connections:   . Frequency of Communication with Friends and Family: Not on file  . Frequency of Social Gatherings with Friends and Family: Not on file  . Attends Religious Services: Not on file  . Active Member of Clubs or Organizations: Not on file  . Attends Theatre manager Meetings: Not on file  . Marital Status: Not on file  Intimate Partner Violence:   . Fear of Current or Ex-Partner: Not on file  . Emotionally Abused: Not on file  . Physically Abused: Not on file  . Sexually Abused: Not on file    Family History  Problem Relation Age of Onset  . Hypertension Mother   . Hypertension Father     BP (!) 173/115   Pulse 81   Ht 6\' 1"  (1.854 m)   Wt 232 lb (105.2 kg)   BMI 30.61 kg/m   Body mass index is 30.61 kg/m.      Objective:   Physical Exam Vitals and nursing note reviewed. Exam conducted with a chaperone present.  Constitutional:      Appearance: He is well-developed.  HENT:     Head: Normocephalic and atraumatic.  Eyes:     Conjunctiva/sclera: Conjunctivae normal.     Pupils: Pupils are equal, round, and reactive to light.  Cardiovascular:     Rate and Rhythm: Normal rate and regular rhythm.  Pulmonary:     Effort: Pulmonary effort is normal.  Abdominal:     Palpations: Abdomen is soft.  Musculoskeletal:       Arms:     Cervical back: Normal range of motion and neck supple.  Skin:    General: Skin is warm and dry.  Neurological:     Mental Status: He is alert and oriented to person, place, and time.     Cranial Nerves: No cranial nerve deficit.     Motor: No abnormal muscle tone.     Coordination: Coordination normal.     Deep Tendon Reflexes: Reflexes are normal and symmetric. Reflexes normal.  Psychiatric:        Behavior: Behavior normal.        Thought Content: Thought content normal.        Judgment: Judgment normal.      X-rays were done of the right elbow, three views, reported separately.     Assessment & Plan:   Encounter Diagnoses  Name Primary?  . Right elbow pain Yes  . Olecranon bursitis, right elbow    I have explained the findings to him.  I will aspirate the bursa but have told him the fluid will most likely recur and he might need surgical excision.  Procedure note: After  permission from the patient, I prepped the right olecranon area.  I aspirated 10 cc of slightly blood tinged fluid and instilled 1% Xylocaine and 1 cc DepoMedrol 40 by sterile technique tolerated well.  An ace bandage was  used.  Return in two weeks.  Call if any problem.  Precautions discussed.   Electronically Signed Sanjuana Kava, MD 10/11/202110:00 AM

## 2020-08-16 NOTE — Telephone Encounter (Signed)
Patient came to office to inquire in regard to an immediate appointment for a right elbow injury - states occurred about a month ago, and in the last day or two, it has become swollen and tender. Due to no availability for work in appointments, patient will go on to [facility] urgent care, and will call back if needs appointment after work-up there.

## 2020-08-31 ENCOUNTER — Ambulatory Visit: Payer: 59 | Admitting: Orthopaedic Surgery

## 2020-09-02 ENCOUNTER — Ambulatory Visit: Payer: 59 | Admitting: Orthopaedic Surgery

## 2020-09-02 ENCOUNTER — Encounter: Payer: Self-pay | Admitting: Orthopaedic Surgery

## 2020-09-02 ENCOUNTER — Other Ambulatory Visit: Payer: Self-pay

## 2020-09-02 VITALS — BP 175/111 | HR 70 | Ht 73.0 in | Wt 232.0 lb

## 2020-09-02 DIAGNOSIS — M7021 Olecranon bursitis, right elbow: Secondary | ICD-10-CM | POA: Diagnosis not present

## 2020-09-02 NOTE — Progress Notes (Signed)
I am better  The right olecranon bursa has come down in size significantly.  He has a little swelling there now but not much.  It is improved.  He has no pain, no redness, full ROM.  NV intact.  Encounter Diagnosis  Name Primary?  Marland Kitchen Olecranon bursitis, right elbow Yes   I will see him as needed.  Call if any problem.  Precautions discussed.   Electronically Signed Sanjuana Kava, MD 10/28/20218:44 AM

## 2020-09-21 ENCOUNTER — Other Ambulatory Visit: Payer: Self-pay

## 2020-09-21 ENCOUNTER — Ambulatory Visit: Payer: 59 | Admitting: Orthopaedic Surgery

## 2020-09-21 ENCOUNTER — Encounter: Payer: Self-pay | Admitting: Orthopaedic Surgery

## 2020-09-21 VITALS — BP 142/90 | HR 68 | Ht 73.0 in | Wt 232.0 lb

## 2020-09-21 DIAGNOSIS — M7021 Olecranon bursitis, right elbow: Secondary | ICD-10-CM | POA: Diagnosis not present

## 2020-09-21 NOTE — Progress Notes (Signed)
I want that sack removed.  He bumped his right elbow and had swelling of the right olecranon bursa.  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  He would like excision of the bursa.  I will have him see Dr. Aline Brochure for olecranon bursa excision.  Encounter Diagnosis  Name Primary?  Marland Kitchen Olecranon bursitis, right elbow Yes   Electronically Signed Sanjuana Kava, MD 11/16/20212:42 PM

## 2020-09-22 ENCOUNTER — Encounter: Payer: Self-pay | Admitting: Orthopedic Surgery

## 2020-09-22 ENCOUNTER — Ambulatory Visit: Payer: 59 | Admitting: Orthopedic Surgery

## 2020-09-22 VITALS — BP 149/99 | HR 72 | Ht 73.0 in | Wt 232.0 lb

## 2020-09-22 DIAGNOSIS — M7021 Olecranon bursitis, right elbow: Secondary | ICD-10-CM | POA: Diagnosis not present

## 2020-09-22 MED ORDER — CEPHALEXIN 500 MG PO CAPS: 500.0000 mg | ORAL_CAPSULE | Freq: Two times a day (BID) | ORAL | 0 refills | Status: AC

## 2020-09-22 NOTE — Progress Notes (Signed)
[place]  Chief Complaint  Patient presents with  . 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  He has had several aspirations he does not wanted to have another aspiration so he is presenting now for surgery  He has a little bit of pain when he puts his arm down the bursal sac of course is swollen has not affected his range of motion or strength at this point  He does have some warmth around the swollen bursa   Review of Systems  Constitutional: Negative.   Skin: Negative.   Neurological: Negative for tingling.     Past Medical History:  Diagnosis Date  . Anxiety    celexa helps chill out a little  . Chronic sinusitis   . Hypertension   . Snoring     Past Surgical History:  Procedure Laterality Date  . EXCISION CHONCHA BULLOSA Bilateral 10/19/2014   Procedure: EXCISION CHONCHA BULLOSA;  Surgeon: Ascencion Dike, MD;  Location: [place];  Service: ENT;  Laterality: Bilateral;  . LITHOTRIPSY  2013  . NASAL SEPTOPLASTY W/ TURBINOPLASTY Bilateral 10/19/2014   Procedure: NASAL SEPTOPLASTY WITH TURBINATE REDUCTION;  Surgeon: Ascencion Dike, MD;  Location: [place];  Service: ENT;  Laterality: Bilateral;  . SHOULDER SURGERY  2008   left    Family History  Problem Relation Age of Onset  . Hypertension Mother   . Hypertension Father    Social History   Tobacco Use  . Smoking status: Current Every Day Smoker    Packs/day: 0.50    Years: 15.00    Pack years: 7.50    Types: Cigarettes  . Smokeless tobacco: Current User    Types: Chew  Substance Use Topics  . Alcohol use: Yes    Alcohol/week: 47.0 standard drinks    Types: 12 Standard drinks or equivalent, 35 Cans of beer per week    Comment: 6/day  . Drug use: No    No Known Allergies   Current Meds  Medication Sig  . cetirizine (ZYRTEC)  10 MG tablet Take 10 mg by mouth daily.  . citalopram (CELEXA) 20 MG tablet Take 20 mg by mouth daily.    Marland Kitchen olmesartan (BENICAR) 40 MG tablet Take 40 mg by mouth daily.  Marland Kitchen omeprazole (PRILOSEC) 40 MG capsule Take 40 mg by mouth daily.    BP (!) 149/99   Pulse 72   Ht 6\' 1"  (1.854 m)   Wt 232 lb (105.2 kg)   BMI 30.61 kg/m   Physical Exam Constitutional:      General: He is not in acute distress.    Appearance: He is well-developed.  Cardiovascular:     Comments: No peripheral edema Skin:    General: Skin is warm and dry.  Neurological:     Mental Status: He is alert and oriented to person, place, and time.     Sensory: No sensory deficit.     Coordination: Coordination normal.     Gait: Gait normal.     Deep Tendon Reflexes: Reflexes are normal and symmetric.     Ortho Exam  Right elbow has a large swollen bursal sac.  Area is warm to touch she has tenderness over the olecranon in the area where the x-ray shows a spur  He has full range of motion normal strength in flexion-extension no instability is noted  in the elbow  The skin is a little red    MEDICAL DECISION SECTION  xrays ordered? no  My independent reading of xrays: Images show a fairly large spur at the tip of the olecranon the joint is otherwise normal   Encounter Diagnosis  Name Primary?  Marland Kitchen Olecranon bursitis, right elbow Yes     PLAN:     Surgical procedure planned: Excision of right olecranon bursa with spur excision  The procedure has been fully reviewed with the patient; The risks and benefits of surgery have been discussed and explained and understood. Alternative treatment has also been reviewed, questions were encouraged and answered. The postoperative plan is also been reviewed.  Prophylactic antibiotics   Meds ordered this encounter  Medications  . cephALEXin (KEFLEX) 500 MG capsule    Sig: Take 1 capsule (500 mg total) by mouth 2 (two) times daily for 10 days.    Dispense:  20  capsule    Refill:  0    Arther Abbott, MD 09/22/2020 10:00 AM

## 2020-09-28 ENCOUNTER — Other Ambulatory Visit: Payer: Self-pay | Admitting: Orthopedic Surgery

## 2020-09-28 DIAGNOSIS — M7021 Olecranon bursitis, right elbow: Secondary | ICD-10-CM

## 2020-09-28 NOTE — Progress Notes (Signed)
Surgery approved Authorization #: [id number]

## 2020-10-04 NOTE — Patient Instructions (Signed)
[facility]  10/04/2020     @PREFPERIOPPHARMACY @   Your procedure is scheduled on  10/08/2020  Report to [facility] at  [phone]  A.M.  Call this number if you have problems the morning of surgery:  [phone]   Remember:  Do not eat or drink after midnight.                        Take these medicines the morning of surgery with A SIP OF WATER zyrtec, celexa, humibid, omeprazole.    Do not wear jewelry, make-up or nail polish.  Do not wear lotions, powders, or perfumes, or deodorant. Please brush your teeth.  Do not shave 48 hours prior to surgery.  Men may shave face and neck.  Do not bring valuables to the hospital.  [facility] is not responsible for any belongings or valuables.  Contacts, dentures or bridgework may not be worn into surgery.  Leave your suitcase in the car.  After surgery it may be brought to your room.  For patients admitted to the hospital, discharge time will be determined by your treatment team.  Patients discharged the day of surgery will not be allowed to drive home.   Name and phone number of your driver:   family Special instructions:  DO NOT smoke the morning of your procedure.  Please read over the following fact sheets that you were given. Anesthesia Post-op Instructions and Care and Recovery After Surgery       Incision Care, Adult An incision is a cut that a doctor makes in your skin for surgery (for a procedure). Most times, these cuts are closed after surgery. Your cut from surgery may be closed with stitches (sutures), staples, skin glue, or skin tape (adhesive strips). You may need to return to your doctor to have stitches or staples taken out. This may happen many days or many weeks after your surgery. The cut needs to be well cared for so it does not get infected. How to care for your cut Cut care   Follow instructions from your doctor about how to take care of your cut. Make sure you: ? Wash your hands with soap  and water before you change your bandage (dressing). If you cannot use soap and water, use hand sanitizer. ? Change your bandage as told by your doctor. ? Leave stitches, skin glue, or skin tape in place. They may need to stay in place for 2 weeks or longer. If tape strips get loose and curl up, you may trim the loose edges. Do not remove tape strips completely unless your doctor says it is okay.  Check your cut area every day for signs of infection. Check for: ? More redness, swelling, or pain. ? More fluid or blood. ? Warmth. ? Pus or a bad smell.  Ask your doctor how to clean the cut. This may include: ? Using mild soap and water. ? Using a clean towel to pat the cut dry after you clean it. ? Putting a cream or ointment on the cut. Do this only as told by your doctor. ? Covering the cut with a clean bandage.  Ask your doctor when you can leave the cut uncovered.  Do not take baths, swim, or use a hot tub until your doctor says it is okay. Ask your doctor if you can take showers. You may only be allowed to take  sponge baths for bathing. Medicines  If you were prescribed an antibiotic medicine, cream, or ointment, take the antibiotic or put it on the cut as told by your doctor. Do not stop taking or putting on the antibiotic even if your condition gets better.  Take over-the-counter and prescription medicines only as told by your doctor. General instructions  Limit movement around your cut. This helps healing. ? Avoid straining, lifting, or exercise for the first month, or for as long as told by your doctor. ? Follow instructions from your doctor about going back to your normal activities. ? Ask your doctor what activities are safe.  Protect your cut from the sun when you are outside for the first 6 months, or for as long as told by your doctor. Put on sunscreen around the scar or cover up the scar.  Keep all follow-up visits as told by your doctor. This is important. Contact a  doctor if:  Your have more redness, swelling, or pain around the cut.  You have more fluid or blood coming from the cut.  Your cut feels warm to the touch.  You have pus or a bad smell coming from the cut.  You have a fever or shaking chills.  You feel sick to your stomach (nauseous) or you throw up (vomit).  You are dizzy.  Your stitches or staples come undone. Get help right away if:  You have a red streak coming from your cut.  Your cut bleeds through the bandage and the bleeding does not stop with gentle pressure.  The edges of your cut open up and separate.  You have very bad (severe) pain.  You have a rash.  You are confused.  You pass out (faint).  You have trouble breathing and you have a fast heartbeat. This information is not intended to replace advice given to you by your health care provider. Make sure you discuss any questions you have with your health care provider. Document Revised: 03/12/2017 Document Reviewed: 06/30/2016 Elsevier Patient Education  [facility].  Elbow Bursitis Bursitis is swelling and pain at the tip of your elbow. This happens when fluid builds up in a sac under your skin (bursa). This may also be called olecranon bursitis. Follow these instructions at home: Medicines  Take over-the-counter and prescription medicines only as told by your doctor.  If you were prescribed an antibiotic, take it exactly as told by your doctor. Do not stop taking it even if you start to feel better. Managing pain, stiffness, and swelling   If told, put ice on your elbow: ? Put ice in a plastic bag. ? Place a towel between your skin and the bag. ? Leave the ice on for 20 minutes, 2-3 times a day.  If your bursitis is caused by an injury, follow instructions from your doctor about: ? Resting your elbow. ? Wearing a bandage.  Wear elbow pads or elbow wraps as needed. These help cushion your elbow. General instructions  Avoid any activities  that cause elbow pain. Ask your doctor what activities are safe for you.  Keep all follow-up visits as told by your doctor. This is important. Contact a doctor if you have:  A fever.  Problems that do not get better with treatment.  Pain or swelling that: ? Gets worse. ? Goes away and then comes back.  Pus draining from your elbow. Get help right away if you have:  Trouble moving your arm, hand, or fingers. Summary  Bursitis  is swelling and pain at the tip of the elbow.  You may need to take medicine or put ice on your elbow.  Contact your doctor if your problems do not get better with treatment. This information is not intended to replace advice given to you by your health care provider. Make sure you discuss any questions you have with your health care provider. Document Revised: 10/05/2017 Document Reviewed: 10/02/2017 Elsevier Patient Education  2020 [place] Anesthesia, Adult, Care After This sheet gives you information about how to care for yourself after your procedure. Your health care provider may also give you more specific instructions. If you have problems or questions, contact your health care provider. What can I expect after the procedure? After the procedure, the following side effects are common:  Pain or discomfort at the IV site.  Nausea.  Vomiting.  Sore throat.  Trouble concentrating.  Feeling cold or chills.  Weak or tired.  Sleepiness and fatigue.  Soreness and body aches. These side effects can affect parts of the body that were not involved in surgery. Follow these instructions at home:  For at least 24 hours after the procedure:  Have a responsible adult stay with you. It is important to have someone help care for you until you are awake and alert.  Rest as needed.  Do not: ? Participate in activities in which you could fall or become injured. ? Drive. ? Use heavy machinery. ? Drink alcohol. ? Take sleeping pills  or medicines that cause drowsiness. ? Make important decisions or sign legal documents. ? Take care of children on your own. Eating and drinking  Follow any instructions from your health care provider about eating or drinking restrictions.  When you feel hungry, start by eating small amounts of foods that are soft and easy to digest (bland), such as toast. Gradually return to your regular diet.  Drink enough fluid to keep your urine pale yellow.  If you vomit, rehydrate by drinking water, juice, or clear broth. General instructions  If you have sleep apnea, surgery and certain medicines can increase your risk for breathing problems. Follow instructions from your health care provider about wearing your sleep device: ? Anytime you are sleeping, including during daytime naps. ? While taking prescription pain medicines, sleeping medicines, or medicines that make you drowsy.  Return to your normal activities as told by your health care provider. Ask your health care provider what activities are safe for you.  Take over-the-counter and prescription medicines only as told by your health care provider.  If you smoke, do not smoke without supervision.  Keep all follow-up visits as told by your health care provider. This is important. Contact a health care provider if:  You have nausea or vomiting that does not get better with medicine.  You cannot eat or drink without vomiting.  You have pain that does not get better with medicine.  You are unable to pass urine.  You develop a skin rash.  You have a fever.  You have redness around your IV site that gets worse. Get help right away if:  You have difficulty breathing.  You have chest pain.  You have blood in your urine or stool, or you vomit blood. Summary  After the procedure, it is common to have a sore throat or nausea. It is also common to feel tired.  Have a responsible adult stay with you for the first 24 hours after  general anesthesia. It is important  to have someone help care for you until you are awake and alert.  When you feel hungry, start by eating small amounts of foods that are soft and easy to digest (bland), such as toast. Gradually return to your regular diet.  Drink enough fluid to keep your urine pale yellow.  Return to your normal activities as told by your health care provider. Ask your health care provider what activities are safe for you. This information is not intended to replace advice given to you by your health care provider. Make sure you discuss any questions you have with your health care provider. Document Revised: 10/26/2017 Document Reviewed: 06/08/2017 Elsevier Patient Education  [place]. How to Use Chlorhexidine for Bathing Chlorhexidine gluconate (CHG) is a germ-killing (antiseptic) solution that is used to clean the skin. It can get rid of the bacteria that normally live on the skin and can keep them away for about 24 hours. To clean your skin with CHG, you may be given:  A CHG solution to use in the shower or as part of a sponge bath.  A prepackaged cloth that contains CHG. Cleaning your skin with CHG may help lower the risk for infection:  While you are staying in the intensive care unit of the hospital.  If you have a vascular access, such as a central line, to provide short-term or long-term access to your veins.  If you have a catheter to drain urine from your bladder.  If you are on a ventilator. A ventilator is a machine that helps you breathe by moving air in and out of your lungs.  After surgery. What are the risks? Risks of using CHG include:  A skin reaction.  Hearing loss, if CHG gets in your ears.  Eye injury, if CHG gets in your eyes and is not rinsed out.  The CHG product catching fire. Make sure that you avoid smoking and flames after applying CHG to your skin. Do not use CHG:  If you have a chlorhexidine allergy or have previously  reacted to chlorhexidine.  On babies younger than 45 months of age. How to use CHG solution  Use CHG only as told by your health care provider, and follow the instructions on the label.  Use the full amount of CHG as directed. Usually, this is one bottle. During a shower Follow these steps when using CHG solution during a shower (unless your health care provider gives you different instructions): 1. Start the shower. 2. Use your normal soap and shampoo to wash your face and hair. 3. Turn off the shower or move out of the shower stream. 4. Pour the CHG onto a clean washcloth. Do not use any type of brush or rough-edged sponge. 5. Starting at your neck, lather your body down to your toes. Make sure you follow these instructions: ? If you will be having surgery, pay special attention to the part of your body where you will be having surgery. Scrub this area for at least 1 minute. ? Do not use CHG on your head or face. If the solution gets into your ears or eyes, rinse them well with water. ? Avoid your genital area. ? Avoid any areas of skin that have broken skin, cuts, or scrapes. ? Scrub your back and under your arms. Make sure to wash skin folds. 6. Let the lather sit on your skin for 1-2 minutes or as long as told by your health care provider. 7. Thoroughly rinse your entire body  in the shower. Make sure that all body creases and crevices are rinsed well. 8. Dry off with a clean towel. Do not put any substances on your body afterward--such as powder, lotion, or perfume--unless you are told to do so by your health care provider. Only use lotions that are recommended by the manufacturer. 9. Put on clean clothes or pajamas. 10. If it is the night before your surgery, sleep in clean sheets.  During a sponge bath Follow these steps when using CHG solution during a sponge bath (unless your health care provider gives you different instructions): 1. Use your normal soap and shampoo to wash your  face and hair. 2. Pour the CHG onto a clean washcloth. 3. Starting at your neck, lather your body down to your toes. Make sure you follow these instructions: ? If you will be having surgery, pay special attention to the part of your body where you will be having surgery. Scrub this area for at least 1 minute. ? Do not use CHG on your head or face. If the solution gets into your ears or eyes, rinse them well with water. ? Avoid your genital area. ? Avoid any areas of skin that have broken skin, cuts, or scrapes. ? Scrub your back and under your arms. Make sure to wash skin folds. 4. Let the lather sit on your skin for 1-2 minutes or as long as told by your health care provider. 5. Using a different clean, wet washcloth, thoroughly rinse your entire body. Make sure that all body creases and crevices are rinsed well. 6. Dry off with a clean towel. Do not put any substances on your body afterward--such as powder, lotion, or perfume--unless you are told to do so by your health care provider. Only use lotions that are recommended by the manufacturer. 7. Put on clean clothes or pajamas. 8. If it is the night before your surgery, sleep in clean sheets. How to use CHG prepackaged cloths  Only use CHG cloths as told by your health care provider, and follow the instructions on the label.  Use the CHG cloth on clean, dry skin.  Do not use the CHG cloth on your head or face unless your health care provider tells you to.  When washing with the CHG cloth: ? Avoid your genital area. ? Avoid any areas of skin that have broken skin, cuts, or scrapes. Before surgery Follow these steps when using a CHG cloth to clean before surgery (unless your health care provider gives you different instructions): 1. Using the CHG cloth, vigorously scrub the part of your body where you will be having surgery. Scrub using a back-and-forth motion for 3 minutes. The area on your body should be completely wet with CHG when you  are done scrubbing. 2. Do not rinse. Discard the cloth and let the area air-dry. Do not put any substances on the area afterward, such as powder, lotion, or perfume. 3. Put on clean clothes or pajamas. 4. If it is the night before your surgery, sleep in clean sheets.  For general bathing Follow these steps when using CHG cloths for general bathing (unless your health care provider gives you different instructions). 1. Use a separate CHG cloth for each area of your body. Make sure you wash between any folds of skin and between your fingers and toes. Wash your body in the following order, switching to a new cloth after each step: ? The front of your neck, shoulders, and chest. ? Both of  your arms, under your arms, and your hands. ? Your stomach and groin area, avoiding the genitals. ? Your right leg and foot. ? Your left leg and foot. ? The back of your neck, your back, and your buttocks. 2. Do not rinse. Discard the cloth and let the area air-dry. Do not put any substances on your body afterward--such as powder, lotion, or perfume--unless you are told to do so by your health care provider. Only use lotions that are recommended by the manufacturer. 3. Put on clean clothes or pajamas. Contact a health care provider if:  Your skin gets irritated after scrubbing.  You have questions about using your solution or cloth. Get help right away if:  Your eyes become very red or swollen.  Your eyes itch badly.  Your skin itches badly and is red or swollen.  Your hearing changes.  You have trouble seeing.  You have swelling or tingling in your mouth or throat.  You have trouble breathing.  You swallow any chlorhexidine. Summary  Chlorhexidine gluconate (CHG) is a germ-killing (antiseptic) solution that is used to clean the skin. Cleaning your skin with CHG may help to lower your risk for infection.  You may be given CHG to use for bathing. It may be in a bottle or in a prepackaged cloth  to use on your skin. Carefully follow your health care provider's instructions and the instructions on the product label.  Do not use CHG if you have a chlorhexidine allergy.  Contact your health care provider if your skin gets irritated after scrubbing. This information is not intended to replace advice given to you by your health care provider. Make sure you discuss any questions you have with your health care provider. Document Revised: 01/09/2019 Document Reviewed: 09/20/2017 Elsevier Patient Education  [place].

## 2020-10-06 ENCOUNTER — Other Ambulatory Visit: Payer: Self-pay

## 2020-10-06 ENCOUNTER — Encounter (HOSPITAL_COMMUNITY)
Admission: RE | Admit: 2020-10-06 | Discharge: 2020-10-06 | Disposition: A | Discharge status: Home / Self Care | Payer: 59 | Source: Ambulatory Visit | Attending: Orthopedic Surgery | Admitting: Orthopedic Surgery

## 2020-10-06 ENCOUNTER — Other Ambulatory Visit (HOSPITAL_COMMUNITY)
Admission: RE | Admit: 2020-10-06 | Discharge: 2020-10-06 | Disposition: A | Discharge status: Home / Self Care | Payer: 59 | Source: Ambulatory Visit | Attending: Orthopedic Surgery | Admitting: Orthopedic Surgery

## 2020-10-06 ENCOUNTER — Encounter (HOSPITAL_COMMUNITY): Payer: Self-pay

## 2020-10-06 DIAGNOSIS — Z01818 Encounter for other preprocedural examination: Secondary | ICD-10-CM | POA: Diagnosis not present

## 2020-10-06 DIAGNOSIS — Z20822 Contact with and (suspected) exposure to covid-19: Secondary | ICD-10-CM | POA: Insufficient documentation

## 2020-10-06 HISTORY — DX: Other specified postprocedural states: Z98.890

## 2020-10-06 HISTORY — DX: Other specified postprocedural states: R11.2

## 2020-10-06 HISTORY — DX: Other complications of anesthesia, initial encounter: T88.59XA

## 2020-10-06 LAB — CBC WITH DIFFERENTIAL/PLATELET
Abs Immature Granulocytes: 0.04 10*3/uL (ref 0.00–0.07)
Basophils Absolute: 0.1 10*3/uL (ref 0.0–0.1)
Basophils Relative: 1 %
Eosinophils Absolute: 0.4 10*3/uL (ref 0.0–0.5)
Eosinophils Relative: 7 %
HCT: 45.6 % (ref 39.0–52.0)
Hemoglobin: 14.3 g/dL (ref 13.0–17.0)
Immature Granulocytes: 1 %
Lymphocytes Relative: 30 %
Lymphs Abs: 1.9 10*3/uL (ref 0.7–4.0)
MCH: 31.5 pg (ref 26.0–34.0)
MCHC: 31.4 g/dL (ref 30.0–36.0)
MCV: 100.4 fL — ABNORMAL HIGH (ref 80.0–100.0)
Monocytes Absolute: 0.6 10*3/uL (ref 0.1–1.0)
Monocytes Relative: 9 %
Neutro Abs: 3.3 10*3/uL (ref 1.7–7.7)
Neutrophils Relative %: 52 %
Platelets: 340 10*3/uL (ref 150–400)
RBC: 4.54 MIL/uL (ref 4.22–5.81)
RDW: 11.7 % (ref 11.5–15.5)
WBC: 6.3 10*3/uL (ref 4.0–10.5)
nRBC: 0 % (ref 0.0–0.2)

## 2020-10-06 LAB — BASIC METABOLIC PANEL
Anion gap: 10 (ref 5–15)
BUN: 13 mg/dL (ref 6–20)
CO2: 27 mmol/L (ref 22–32)
Calcium: 9.3 mg/dL (ref 8.9–10.3)
Chloride: 100 mmol/L (ref 98–111)
Creatinine, Ser: 0.69 mg/dL (ref 0.61–1.24)
GFR, Estimated: >60 mL/min (ref >60)
Glucose, Bld: 107 mg/dL — ABNORMAL HIGH (ref 70–99)
Potassium: 4.3 mmol/L (ref 3.5–5.1)
Sodium: 137 mmol/L (ref 135–145)

## 2020-10-06 LAB — SARS CORONAVIRUS 2 (TAT 6-24 HRS): SARS Coronavirus 2: NEGATIVE

## 2020-10-07 NOTE — H&P (Signed)
[place]       Chief Complaint  Patient presents with  . 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  He has had several aspirations he does not wanted to have another aspiration so he is presenting now for surgery   He has a little bit of pain when he puts his arm down the bursal sac of course is swollen has not affected his range of motion or strength at this point   He does have some warmth around the swollen bursa     Review of Systems  Constitutional: Negative.   Skin: Negative.   Neurological: Negative for tingling.        Past Medical History:  Diagnosis Date  . Anxiety      celexa helps chill out a little  . Chronic sinusitis    . Hypertension    . Snoring             Past Surgical History:  Procedure Laterality Date  . EXCISION CHONCHA BULLOSA Bilateral 10/19/2014    Procedure: EXCISION CHONCHA BULLOSA;  Surgeon: Ascencion Dike, MD;  Location: [place];  Service: ENT;  Laterality: Bilateral;  . LITHOTRIPSY   2013  . NASAL SEPTOPLASTY W/ TURBINOPLASTY Bilateral 10/19/2014    Procedure: NASAL SEPTOPLASTY WITH TURBINATE REDUCTION;  Surgeon: Ascencion Dike, MD;  Location: [facility];  Service: ENT;  Laterality: Bilateral;  . SHOULDER SURGERY   2008    left           Family History  Problem Relation Age of Onset  . Hypertension Mother    . Hypertension Father      Social History         Tobacco Use  . Smoking status: Current Every Day Smoker      Packs/day: 0.50      Years: 15.00      Pack years: 7.50      Types: Cigarettes  . Smokeless tobacco: Current User      Types: Chew  Substance Use Topics  . Alcohol use: Yes      Alcohol/week: 47.0 standard drinks      Types: 12 Standard drinks or equivalent, 35 Cans of beer per week      Comment: 6/day  . Drug use: No      No  Known Allergies     Active Medications      Current Meds  Medication Sig  . cetirizine (ZYRTEC) 10 MG tablet Take 10 mg by mouth daily.  . citalopram (CELEXA) 20 MG tablet Take 20 mg by mouth daily.    Marland Kitchen olmesartan (BENICAR) 40 MG tablet Take 40 mg by mouth daily.  Marland Kitchen omeprazole (PRILOSEC) 40 MG capsule Take 40 mg by mouth daily.        BP (!) 149/99   Pulse 72   Ht 6\' 1"  (1.854 m)   Wt 232 lb (105.2 kg)   BMI 30.61 kg/m    Physical Exam Constitutional:      General: He is not in acute distress.    Appearance: He is well-developed.  Cardiovascular:     Comments: No peripheral edema Skin:    General: Skin is warm and dry.  Neurological:     Mental Status: He is alert and oriented to person, place, and time.  Sensory: No sensory deficit.     Coordination: Coordination normal.     Gait: Gait normal.     Deep Tendon Reflexes: Reflexes are normal and symmetric.       Ortho Exam   Right elbow has a large swollen bursal sac.  Area is warm to touch she has tenderness over the olecranon in the area where the x-ray shows a spur   He has full range of motion normal strength in flexion-extension no instability is noted in the elbow   The skin is a little red      MEDICAL DECISION SECTION  xrays ordered? no   My independent reading of xrays: Images show a fairly large spur at the tip of the olecranon the joint is otherwise normal         Encounter Diagnosis  Name Primary?  Marland Kitchen Olecranon bursitis, right elbow Yes        PLAN:        Surgical procedure planned: Excision of right olecranon bursa with spur excision   The procedure has been fully reviewed with the patient; The risks and benefits of surgery have been discussed and explained and understood. Alternative treatment has also been reviewed, questions were encouraged and answered. The postoperative plan is also been reviewed.   Prophylactic antibiotics        Meds ordered this encounter  Medications  .  cephALEXin (KEFLEX) 500 MG capsule      Sig: Take 1 capsule (500 mg total) by mouth 2 (two) times daily for 10 days.      Dispense:  20 capsule      Refill:  0      Arther Abbott, MD 09/22/2020 10:00 AM

## 2020-10-08 ENCOUNTER — Ambulatory Visit (HOSPITAL_COMMUNITY): Payer: 59 | Admitting: Anesthesiology

## 2020-10-08 ENCOUNTER — Ambulatory Visit (HOSPITAL_COMMUNITY)
Admission: RE | Admit: 2020-10-08 | Discharge: 2020-10-08 | Disposition: A | Discharge status: Home / Self Care | Payer: 59 | Attending: Orthopedic Surgery | Admitting: Orthopedic Surgery

## 2020-10-08 ENCOUNTER — Ambulatory Visit (HOSPITAL_COMMUNITY): Payer: 59

## 2020-10-08 ENCOUNTER — Encounter (HOSPITAL_COMMUNITY): Payer: Self-pay | Admitting: Orthopedic Surgery

## 2020-10-08 ENCOUNTER — Encounter (HOSPITAL_COMMUNITY)
Admission: RE | Disposition: A | Discharge status: Home / Self Care | Payer: Self-pay | Source: Home / Self Care | Attending: Orthopedic Surgery

## 2020-10-08 DIAGNOSIS — F17210 Nicotine dependence, cigarettes, uncomplicated: Secondary | ICD-10-CM | POA: Insufficient documentation

## 2020-10-08 DIAGNOSIS — Z79899 Other long term (current) drug therapy: Secondary | ICD-10-CM | POA: Diagnosis not present

## 2020-10-08 DIAGNOSIS — M7021 Olecranon bursitis, right elbow: Secondary | ICD-10-CM

## 2020-10-08 HISTORY — PX: OLECRANON BURSECTOMY: SHX2097

## 2020-10-08 SURGERY — BURSECTOMY, ELBOW
Anesthesia: General | Site: Elbow | Laterality: Right

## 2020-10-08 MED ORDER — IBUPROFEN 800 MG PO TABS: 800.0000 mg | ORAL_TABLET | Freq: Three times a day (TID) | ORAL | 1 refills | Status: AC | PRN

## 2020-10-08 MED ORDER — BUPIVACAINE-EPINEPHRINE 0.5% -1:200000 IJ SOLN: INTRAMUSCULAR | Status: DC | PRN

## 2020-10-08 MED ORDER — ONDANSETRON HCL 4 MG/2ML IJ SOLN
INTRAMUSCULAR | Status: AC
  Filled 2020-10-08: qty 2

## 2020-10-08 MED ORDER — ONDANSETRON HCL 4 MG/2ML IJ SOLN: 4.0000 mg | Freq: Once | INTRAMUSCULAR | Status: DC | PRN

## 2020-10-08 MED ORDER — FENTANYL CITRATE (PF) 100 MCG/2ML IJ SOLN
25.0000 ug | INTRAMUSCULAR | Status: DC | PRN
  Administered 2020-10-08: 50 ug via INTRAVENOUS
  Filled 2020-10-08: qty 2

## 2020-10-08 MED ORDER — IBUPROFEN 800 MG PO TABS
800.0000 mg | ORAL_TABLET | Freq: Once | ORAL | Status: AC
  Administered 2020-10-08: 800 mg via ORAL

## 2020-10-08 MED ORDER — LACTATED RINGERS IV SOLN: INTRAVENOUS | Status: DC

## 2020-10-08 MED ORDER — MIDAZOLAM HCL 5 MG/5ML IJ SOLN
INTRAMUSCULAR | Status: DC | PRN
  Administered 2020-10-08: 2 mg via INTRAVENOUS

## 2020-10-08 MED ORDER — PROPOFOL 10 MG/ML IV BOLUS
INTRAVENOUS | Status: AC
  Filled 2020-10-08: qty 20

## 2020-10-08 MED ORDER — HYDROCODONE-ACETAMINOPHEN 5-325 MG PO TABS: 1.0000 | ORAL_TABLET | ORAL | 0 refills | Status: DC | PRN

## 2020-10-08 MED ORDER — FENTANYL CITRATE (PF) 100 MCG/2ML IJ SOLN
INTRAMUSCULAR | Status: AC
  Filled 2020-10-08: qty 2

## 2020-10-08 MED ORDER — ACETAMINOPHEN 500 MG PO TABS
500.0000 mg | ORAL_TABLET | Freq: Once | ORAL | Status: AC
  Administered 2020-10-08: 500 mg via ORAL

## 2020-10-08 MED ORDER — PROPOFOL 10 MG/ML IV BOLUS
INTRAVENOUS | Status: DC | PRN
  Administered 2020-10-08: 200 mg via INTRAVENOUS

## 2020-10-08 MED ORDER — HYDROCODONE-ACETAMINOPHEN 5-325 MG PO TABS
ORAL_TABLET | ORAL | Status: AC
  Filled 2020-10-08: qty 1

## 2020-10-08 MED ORDER — 0.9 % SODIUM CHLORIDE (POUR BTL) OPTIME
TOPICAL | Status: DC | PRN
  Administered 2020-10-08: 1000 mL

## 2020-10-08 MED ORDER — ACETAMINOPHEN 500 MG PO TABS
ORAL_TABLET | ORAL | Status: AC
  Filled 2020-10-08: qty 1

## 2020-10-08 MED ORDER — ONDANSETRON HCL 4 MG/2ML IJ SOLN
INTRAMUSCULAR | Status: DC | PRN
  Administered 2020-10-08: 4 mg via INTRAVENOUS

## 2020-10-08 MED ORDER — HYDROCODONE-ACETAMINOPHEN 5-325 MG PO TABS
1.0000 | ORAL_TABLET | Freq: Once | ORAL | Status: AC
  Administered 2020-10-08: 1 via ORAL

## 2020-10-08 MED ORDER — EPHEDRINE 5 MG/ML INJ
INTRAVENOUS | Status: AC
  Filled 2020-10-08: qty 30

## 2020-10-08 MED ORDER — ORAL CARE MOUTH RINSE: 15.0000 mL | Freq: Once | OROMUCOSAL | Status: DC

## 2020-10-08 MED ORDER — LACTATED RINGERS IV SOLN: INTRAVENOUS | Status: DC | PRN

## 2020-10-08 MED ORDER — MIDAZOLAM HCL 2 MG/2ML IJ SOLN
INTRAMUSCULAR | Status: AC
  Filled 2020-10-08: qty 2

## 2020-10-08 MED ORDER — PHENYLEPHRINE 40 MCG/ML (10ML) SYRINGE FOR IV PUSH (FOR BLOOD PRESSURE SUPPORT)
PREFILLED_SYRINGE | INTRAVENOUS | Status: AC
  Filled 2020-10-08: qty 20

## 2020-10-08 MED ORDER — CHLORHEXIDINE GLUCONATE 0.12 % MT SOLN: 15.0000 mL | Freq: Once | OROMUCOSAL | Status: DC

## 2020-10-08 MED ORDER — CEFAZOLIN SODIUM-DEXTROSE 2-4 GM/100ML-% IV SOLN
2.0000 g | INTRAVENOUS | Status: AC
  Administered 2020-10-08: 2 g via INTRAVENOUS
  Filled 2020-10-08: qty 100

## 2020-10-08 MED ORDER — BUPIVACAINE-EPINEPHRINE (PF) 0.5% -1:200000 IJ SOLN
INTRAMUSCULAR | Status: AC
  Filled 2020-10-08: qty 30

## 2020-10-08 MED ORDER — FENTANYL CITRATE (PF) 100 MCG/2ML IJ SOLN
INTRAMUSCULAR | Status: DC | PRN
  Administered 2020-10-08 (×2): 25 ug via INTRAVENOUS
  Administered 2020-10-08 (×2): 50 ug via INTRAVENOUS
  Administered 2020-10-08: 25 ug via INTRAVENOUS

## 2020-10-08 MED ORDER — LIDOCAINE 2% (20 MG/ML) 5 ML SYRINGE
INTRAMUSCULAR | Status: DC | PRN
  Administered 2020-10-08: 100 mg via INTRAVENOUS

## 2020-10-08 MED ORDER — IBUPROFEN 800 MG PO TABS
ORAL_TABLET | ORAL | Status: AC
  Filled 2020-10-08: qty 1

## 2020-10-08 SURGICAL SUPPLY — 50 items
APL PRP STRL LF DISP 70% ISPRP (MISCELLANEOUS) ×1
BANDAGE ESMARK 4X12 BL STRL LF (DISPOSABLE) ×1 IMPLANT
BLADE SURG SZ10 CARB STEEL (BLADE) ×3 IMPLANT
BNDG CMPR 12X4 ELC STRL LF (DISPOSABLE) ×1
BNDG CMPR STD VLCR NS LF 5.8X3 (GAUZE/BANDAGES/DRESSINGS) ×1
BNDG CMPR STD VLCR NS LF 5.8X4 (GAUZE/BANDAGES/DRESSINGS) ×1
BNDG COHESIVE 4X5 TAN STRL (GAUZE/BANDAGES/DRESSINGS) ×2 IMPLANT
BNDG ELASTIC 3X5.8 VLCR NS LF (GAUZE/BANDAGES/DRESSINGS) ×3 IMPLANT
BNDG ELASTIC 4X5.8 VLCR NS LF (GAUZE/BANDAGES/DRESSINGS) ×3 IMPLANT
BNDG ESMARK 4X12 BLUE STRL LF (DISPOSABLE) ×3
BNDG GAUZE ELAST 4 BULKY (GAUZE/BANDAGES/DRESSINGS) ×3 IMPLANT
CHLORAPREP W/TINT 26 (MISCELLANEOUS) ×3 IMPLANT
CLOTH BEACON ORANGE TIMEOUT ST (SAFETY) ×3 IMPLANT
COVER LIGHT HANDLE STERIS (MISCELLANEOUS) ×6 IMPLANT
COVER WAND RF STERILE (DRAPES) ×3 IMPLANT
CUFF TOURN SGL QUICK 18X4 (TOURNIQUET CUFF) ×3 IMPLANT
CUFF TOURN SGL QUICK 24 (TOURNIQUET CUFF) ×3
CUFF TRNQT CYL 24X4X16.5-23 (TOURNIQUET CUFF) IMPLANT
DECANTER SPIKE VIAL GLASS SM (MISCELLANEOUS) ×3 IMPLANT
ELECT REM PT RETURN 9FT ADLT (ELECTROSURGICAL) ×3
ELECTRODE REM PT RTRN 9FT ADLT (ELECTROSURGICAL) ×1 IMPLANT
GAUZE SPONGE 4X4 12PLY STRL (GAUZE/BANDAGES/DRESSINGS) ×2 IMPLANT
GAUZE XEROFORM 5X9 LF (GAUZE/BANDAGES/DRESSINGS) ×5 IMPLANT
GLOVE BIOGEL PI IND STRL 7.0 (GLOVE) ×3 IMPLANT
GLOVE BIOGEL PI INDICATOR 7.0 (GLOVE) ×6
GLOVE SKINSENSE NS SZ8.0 LF (GLOVE) ×2
GLOVE SKINSENSE STRL SZ8.0 LF (GLOVE) ×1 IMPLANT
GLOVE SS N UNI LF 8.5 STRL (GLOVE) ×3 IMPLANT
GOWN STRL REUS W/TWL LRG LVL3 (GOWN DISPOSABLE) ×6 IMPLANT
GOWN STRL REUS W/TWL XL LVL3 (GOWN DISPOSABLE) ×3 IMPLANT
INST SET MINOR BONE (KITS) ×3 IMPLANT
KIT TURNOVER KIT A (KITS) ×3 IMPLANT
MANIFOLD NEPTUNE II (INSTRUMENTS) ×3 IMPLANT
NDL HYPO 21X1.5 SAFETY (NEEDLE) ×1 IMPLANT
NEEDLE HYPO 21X1.5 SAFETY (NEEDLE) ×3 IMPLANT
NS IRRIG 1000ML POUR BTL (IV SOLUTION) ×3 IMPLANT
PACK BASIC LIMB (CUSTOM PROCEDURE TRAY) ×3 IMPLANT
PAD ABD 5X9 TENDERSORB (GAUZE/BANDAGES/DRESSINGS) ×5 IMPLANT
PAD ARMBOARD 7.5X6 YLW CONV (MISCELLANEOUS) ×3 IMPLANT
RASP SM TEAR CROSS CUT (RASP) ×2 IMPLANT
SET BASIN LINEN APH (SET/KITS/TRAYS/PACK) ×3 IMPLANT
SLING ARM FOAM STRAP XLG (SOFTGOODS) ×2 IMPLANT
SPONGE LAP 18X18 RF (DISPOSABLE) ×3 IMPLANT
STAPLER VISISTAT 35W (STAPLE) ×2 IMPLANT
SUT MON AB 2-0 SH 27 (SUTURE) ×6
SUT MON AB 2-0 SH27 (SUTURE) ×1 IMPLANT
SUT VIC AB 1 CT1 27 (SUTURE) ×6
SUT VIC AB 1 CT1 27XBRD ANTBC (SUTURE) IMPLANT
SYR 30ML LL (SYRINGE) ×3 IMPLANT
SYR BULB IRRIG 60ML STRL (SYRINGE) ×5 IMPLANT

## 2020-10-08 NOTE — Discharge Instructions (Signed)
PLEASE CALL THE OFFICE ON Monday TO FIND THE TIME OF THE FOLLOW UP APPOINTMENT      General Anesthesia, Adult, Care After This sheet gives you information about how to care for yourself after your procedure. Your health care provider may also give you more specific instructions. If you have problems or questions, contact your health care provider. What can I expect after the procedure? After the procedure, the following side effects are common:  Pain or discomfort at the IV site.  Nausea.  Vomiting.  Sore throat.  Trouble concentrating.  Feeling cold or chills.  Weak or tired.  Sleepiness and fatigue.  Soreness and body aches. These side effects can affect parts of the body that were not involved in surgery. Follow these instructions at home:  For at least 24 hours after the procedure:  Have a responsible adult stay with you. It is important to have someone help care for you until you are awake and alert.  Rest as needed.  Do not: ? Participate in activities in which you could fall or become injured. ? Drive. ? Use heavy machinery. ? Drink alcohol. ? Take sleeping pills or medicines that cause drowsiness. ? Make important decisions or sign legal documents. ? Take care of children on your own. Eating and drinking  Follow any instructions from your health care provider about eating or drinking restrictions.  When you feel hungry, start by eating small amounts of foods that are soft and easy to digest (bland), such as toast. Gradually return to your regular diet.  Drink enough fluid to keep your urine pale yellow.  If you vomit, rehydrate by drinking water, juice, or clear broth. General instructions  If you have sleep apnea, surgery and certain medicines can increase your risk for breathing problems. Follow instructions from your health care provider about wearing your sleep device: ? Anytime you are sleeping, including during daytime naps. ? While taking  prescription pain medicines, sleeping medicines, or medicines that make you drowsy.  Return to your normal activities as told by your health care provider. Ask your health care provider what activities are safe for you.  Take over-the-counter and prescription medicines only as told by your health care provider.  If you smoke, do not smoke without supervision.  Keep all follow-up visits as told by your health care provider. This is important. Contact a health care provider if:  You have nausea or vomiting that does not get better with medicine.  You cannot eat or drink without vomiting.  You have pain that does not get better with medicine.  You are unable to pass urine.  You develop a skin rash.  You have a fever.  You have redness around your IV site that gets worse. Get help right away if:  You have difficulty breathing.  You have chest pain.  You have blood in your urine or stool, or you vomit blood. Summary  After the procedure, it is common to have a sore throat or nausea. It is also common to feel tired.  Have a responsible adult stay with you for the first 24 hours after general anesthesia. It is important to have someone help care for you until you are awake and alert.  When you feel hungry, start by eating small amounts of foods that are soft and easy to digest (bland), such as toast. Gradually return to your regular diet.  Drink enough fluid to keep your urine pale yellow.  Return to your normal activities as told  by your health care provider. Ask your health care provider what activities are safe for you. This information is not intended to replace advice given to you by your health care provider. Make sure you discuss any questions you have with your health care provider. Document Revised: 10/26/2017 Document Reviewed: 06/08/2017 Elsevier Patient Education  [place].

## 2020-10-08 NOTE — Brief Op Note (Signed)
10/08/2020  12:14 PM  PATIENT:  Adrian Lawrence  44 y.o. male  PRE-OPERATIVE DIAGNOSIS:  Right elbow olecranon bursectomy  POST-OPERATIVE DIAGNOSIS:  Right elbow olecranon bursectomy  PROCEDURE:  Procedure(s): OLECRANON BURSECTOMY RIGHT ELBOW (Right)  SURGEON:  Surgeon(s) and Role:    Carole Civil, MD - Primary  Mr. Prajapati was seen in the preop area the surgical site was confirmed his right elbow was marked.  Chart review was completed  Patient was stable for surgery  Patient went to the operating room for general anesthesia.  In the supine position his right arm was prepped and draped sterilely.  Timeout confirmed right elbow as the surgical site and bursectomy as a procedure  The limb was exsanguinated with a 4 inch Esmarch tourniquet was elevated 220 mmHg.  A midline incision was made over the left elbow olecranon subcutaneous tissue was divided soft tissue plane between the skin and the bursal sac was created and the bursal sac was removed.  X-ray was then brought in to identify the olecranon spur.  The triceps tendon was split and the spur was exposed.  It was removed with a rondure and the elbow was contoured to a smooth area of remaining bone using a power rasp.  X-ray was brought in to confirm adequate removal.  This was followed by irrigation.  Closure was performed with #1 Vicryl to close the triceps split.  2-0 Monocryl interrupted fashion was used to close the skin followed by staples  Sterile dressing was applied along with an Ace bandage.  Tourniquet was released color and capillary refill returned to normal  Patient extubated taken to recovery room in stable condition   PHYSICIAN ASSISTANT:   ASSISTANTS: none   ANESTHESIA:   general  EBL:  min   BLOOD ADMINISTERED:none  DRAINS: none   LOCAL MEDICATIONS USED:  NONE  SPECIMEN:  Source of Specimen:  right elbow olecranon bursa  DISPOSITION OF SPECIMEN:  PATHOLOGY  COUNTS:  YES  TOURNIQUET:   Total  Tourniquet Time Documented: Forearm (Right) - 30 minutes Total: Forearm (Right) - 30 minutes   DICTATION: .Dragon Dictation  PLAN OF CARE: Discharge to home after PACU  PATIENT DISPOSITION:  PACU - hemodynamically stable.   Delay start of Pharmacological VTE agent (>24hrs) due to surgical blood loss or risk of bleeding: not applicable

## 2020-10-08 NOTE — Op Note (Signed)
10/08/2020  12:14 PM  PATIENT:  Adrian Lawrence  44 y.o. male  PRE-OPERATIVE DIAGNOSIS:  Right elbow olecranon bursectomy  POST-OPERATIVE DIAGNOSIS:  Right elbow olecranon bursectomy  PROCEDURE:  Procedure(s): OLECRANON BURSECTOMY RIGHT ELBOW (Right)  SURGEON:  Surgeon(s) and Role:    Carole Civil, MD - Primary  Mr. Adrian Lawrence was seen in the preop area the surgical site was confirmed his right elbow was marked.  Chart review was completed  Patient was stable for surgery  Patient went to the operating room for general anesthesia.  In the supine position his right arm was prepped and draped sterilely.  Timeout confirmed right elbow as the surgical site and bursectomy as a procedure  The limb was exsanguinated with a 4 inch Esmarch tourniquet was elevated 220 mmHg.  A midline incision was made over the left elbow olecranon subcutaneous tissue was divided soft tissue plane between the skin and the bursal sac was created and the bursal sac was removed.  X-ray was then brought in to identify the olecranon spur.  The triceps tendon was split and the spur was exposed.  It was removed with a rondure and the elbow was contoured to a smooth area of remaining bone using a power rasp.  X-ray was brought in to confirm adequate removal.  This was followed by irrigation.  Closure was performed with #1 Vicryl to close the triceps split.  2-0 Monocryl interrupted fashion was used to close the skin followed by staples  Sterile dressing was applied along with an Ace bandage.  Tourniquet was released color and capillary refill returned to normal  Patient extubated taken to recovery room in stable condition   PHYSICIAN ASSISTANT:   ASSISTANTS: none   ANESTHESIA:   general  EBL:  min   BLOOD ADMINISTERED:none  DRAINS: none   LOCAL MEDICATIONS USED:  NONE  SPECIMEN:  Source of Specimen:  right elbow olecranon bursa  DISPOSITION OF SPECIMEN:  PATHOLOGY  COUNTS:  YES  TOURNIQUET:   Total  Tourniquet Time Documented: Forearm (Right) - 30 minutes Total: Forearm (Right) - 30 minutes   DICTATION: .Dragon Dictation  PLAN OF CARE: Discharge to home after PACU  PATIENT DISPOSITION:  PACU - hemodynamically stable.   Delay start of Pharmacological VTE agent (>24hrs) due to surgical blood loss or risk of bleeding: not applicable

## 2020-10-08 NOTE — Transfer of Care (Signed)
Immediate Anesthesia Transfer of Care Note  Patient: Adrian Lawrence  Procedure(s) Performed: OLECRANON BURSECTOMY RIGHT ELBOW (Right Elbow)  Patient Location: PACU  Anesthesia Type:General  Level of Consciousness: awake  Airway & Oxygen Therapy: Patient Spontanous Breathing  Post-op Assessment: Report given to RN, Post -op Vital signs reviewed and stable and Patient moving all extremities  Post vital signs: Reviewed and stable  Last Vitals:  Vitals Value Taken Time  BP 140/97 10/08/20 1230  Temp 36.9 C 10/08/20 1225  Pulse 81 10/08/20 1232  Resp 18 10/08/20 1232  SpO2 90 % 10/08/20 1232  Vitals shown include unvalidated device data.  Last Pain:  Vitals:   10/08/20 1225  TempSrc:   PainSc: (P) 8       Patients Stated Pain Goal: (P) 8 (94/99/71 8209)  Complications: No complications documented.

## 2020-10-08 NOTE — Interval H&P Note (Signed)
History and Physical Interval Note:  10/08/2020 10:51 AM  Adrian Lawrence  has presented today for surgery, with the diagnosis of Right elbow olecranon bursectomy.  The various methods of treatment have been discussed with the patient and family. After consideration of risks, benefits and other options for treatment, the patient has consented to  Procedure(s): OLECRANON BURSECTOMY RIGHT ELBOW (Right) as a surgical intervention.  The patient's history has been reviewed, patient examined, no change in status, stable for surgery.  I have reviewed the patient's chart and labs.  Questions were answered to the patient's satisfaction.     Arther Abbott

## 2020-10-08 NOTE — Anesthesia Procedure Notes (Signed)
Procedure Name: LMA Insertion Date/Time: 10/08/2020 11:10 AM Performed by: Ollen Bowl, CRNA Pre-anesthesia Checklist: Patient identified, Patient being monitored, Emergency Drugs available, Timeout performed and Suction available Patient Re-evaluated:Patient Re-evaluated prior to induction Oxygen Delivery Method: Circle System Utilized Preoxygenation: Pre-oxygenation with 100% oxygen Induction Type: IV induction Ventilation: Mask ventilation without difficulty LMA: LMA inserted LMA Size: 5.0 Number of attempts: 1 Placement Confirmation: positive ETCO2 and breath sounds checked- equal and bilateral

## 2020-10-08 NOTE — Anesthesia Postprocedure Evaluation (Signed)
Anesthesia Post Note  Patient: Adrian Lawrence  Procedure(s) Performed: OLECRANON BURSECTOMY RIGHT ELBOW (Right Elbow)  Patient location during evaluation: PACU Anesthesia Type: General Level of consciousness: awake and alert Pain management: pain level controlled Vital Signs Assessment: post-procedure vital signs reviewed and stable Respiratory status: spontaneous breathing Cardiovascular status: stable Postop Assessment: no apparent nausea or vomiting Anesthetic complications: no   No complications documented.   Last Vitals:  Vitals:   10/08/20 1225 10/08/20 1229  BP: 134/88   Pulse: 88 85  Resp: 17 20  Temp: 36.9 C   SpO2: 91% 93%    Last Pain:  Vitals:   10/08/20 1225  TempSrc:   PainSc: (P) 8                  Turner Baillie Hristova

## 2020-10-08 NOTE — Anesthesia Preprocedure Evaluation (Signed)
Anesthesia Evaluation  Patient identified by MRN, date of birth, ID band Patient awake    Reviewed: Allergy & Precautions, H&P , NPO status , Patient's Chart, lab work & pertinent test results, reviewed documented beta blocker date and time   History of Anesthesia Complications (+) PONV and history of anesthetic complications  Airway Mallampati: II  TM Distance: >3 FB Neck ROM: full    Dental no notable dental hx.    Pulmonary neg pulmonary ROS, Current Smoker and Patient abstained from smoking.,    Pulmonary exam normal breath sounds clear to auscultation       Cardiovascular Exercise Tolerance: Good hypertension, negative cardio ROS   Rhythm:regular Rate:Normal     Neuro/Psych Anxiety negative neurological ROS  negative psych ROS   GI/Hepatic negative GI ROS, Neg liver ROS,   Endo/Other  negative endocrine ROS  Renal/GU negative Renal ROS  negative genitourinary   Musculoskeletal   Abdominal   Peds  Hematology negative hematology ROS (+)   Anesthesia Other Findings   Reproductive/Obstetrics negative OB ROS                             Anesthesia Physical Anesthesia Plan  ASA: II  Anesthesia Plan: General   Post-op Pain Management:    Induction:   PONV Risk Score and Plan: Ondansetron  Airway Management Planned:   Additional Equipment:   Intra-op Plan:   Post-operative Plan:   Informed Consent: I have reviewed the patients History and Physical, chart, labs and discussed the procedure including the risks, benefits and alternatives for the proposed anesthesia with the patient or authorized representative who has indicated his/her understanding and acceptance.     Dental Advisory Given  Plan Discussed with: CRNA  Anesthesia Plan Comments:         Anesthesia Quick Evaluation

## 2020-10-11 LAB — SURGICAL PATHOLOGY

## 2020-10-12 ENCOUNTER — Telehealth: Payer: Self-pay | Admitting: Orthopedic Surgery

## 2020-10-12 ENCOUNTER — Encounter (HOSPITAL_COMMUNITY): Payer: Self-pay | Admitting: Orthopedic Surgery

## 2020-10-12 ENCOUNTER — Ambulatory Visit: Payer: 59 | Admitting: Orthopedic Surgery

## 2020-10-12 NOTE — Telephone Encounter (Signed)
I called him he states it is his upper arm, I asked him if he thinks it may be bruised from the tourniquet, he states he feels like it has heat in it, did not notice until today, felt fine until then  I worked him in tomorrow at 9:30 am

## 2020-10-12 NOTE — Telephone Encounter (Signed)
Patient called and stated that he felt his arm was swollen.  He also said that it was red on the back of his arm.  He wants to know if he needs an antibiotic?  So we need to have him come in?  Please advise

## 2020-10-13 ENCOUNTER — Other Ambulatory Visit: Payer: Self-pay

## 2020-10-13 ENCOUNTER — Ambulatory Visit (INDEPENDENT_AMBULATORY_CARE_PROVIDER_SITE_OTHER): Payer: 59 | Admitting: Orthopedic Surgery

## 2020-10-13 ENCOUNTER — Encounter: Payer: Self-pay | Admitting: Orthopedic Surgery

## 2020-10-13 DIAGNOSIS — M7021 Olecranon bursitis, right elbow: Secondary | ICD-10-CM

## 2020-10-13 NOTE — Progress Notes (Signed)
Chief Complaint  Patient presents with  . Post-op Follow-up    right elbow / had some redness yesterday better today     Surgery was on December 3 this is postop day 5  Patient saw some swelling in the upper arm status post right elbow bursectomy and excision of olecranon spur however today the swelling has resolved  His suture line looks good.  We can take his staples out the 15th

## 2020-10-14 ENCOUNTER — Ambulatory Visit: Payer: 59 | Admitting: Orthopedic Surgery

## 2020-10-20 ENCOUNTER — Other Ambulatory Visit: Payer: Self-pay

## 2020-10-20 ENCOUNTER — Ambulatory Visit (INDEPENDENT_AMBULATORY_CARE_PROVIDER_SITE_OTHER): Payer: 59 | Admitting: Orthopedic Surgery

## 2020-10-20 DIAGNOSIS — M7021 Olecranon bursitis, right elbow: Secondary | ICD-10-CM

## 2020-10-20 NOTE — Progress Notes (Signed)
Chief Complaint  Patient presents with  . Elbow Problem    R/DOS 10/08/20/ Having some pain, but not real bad   Pod 12  Small fluid collection around the bursal site but the wound looks good the staples can come out I will see him in 3 weeks if it has not gone away and then will drain it in the office.

## 2020-11-11 ENCOUNTER — Other Ambulatory Visit: Payer: Self-pay

## 2020-11-11 ENCOUNTER — Encounter: Payer: Self-pay | Admitting: Orthopedic Surgery

## 2020-11-11 ENCOUNTER — Ambulatory Visit (INDEPENDENT_AMBULATORY_CARE_PROVIDER_SITE_OTHER): Payer: 59 | Admitting: Orthopedic Surgery

## 2020-11-11 DIAGNOSIS — M7021 Olecranon bursitis, right elbow: Secondary | ICD-10-CM | POA: Diagnosis not present

## 2020-11-11 NOTE — Progress Notes (Signed)
Postop visit  Surgery date December 3 status post excision of right olecranon bursa  Mr. Devan has some swelling of the elbow complains of some soreness and tightness at the evening after working  He does have a fluid collection over the elbow incision is clean dry and intact with no signs of infection  We aspirated 18 cc of blood-tinged fluid  Recommend compression bandage 4 weeks should go away by itself if not he will call us back for repeat aspiration  Procedure Aspiration right olecranon bursa Patient gave consent Timeout completed to confirm site as right elbow Alcohol and ethyl chloride used to prepare the site and then 18-gauge needle used to aspirate the 18 cc of blood-tinged fluid Band-Aid and compression dressing applied no complications

## 2020-11-11 NOTE — Patient Instructions (Signed)
Keep pressure bandage on while working

## 2020-12-27 ENCOUNTER — Other Ambulatory Visit: Payer: Self-pay

## 2020-12-27 ENCOUNTER — Ambulatory Visit (INDEPENDENT_AMBULATORY_CARE_PROVIDER_SITE_OTHER): Payer: 59 | Admitting: Orthopedic Surgery

## 2020-12-27 VITALS — BP 177/109 | HR 77 | Ht 73.0 in | Wt 243.5 lb

## 2020-12-27 DIAGNOSIS — M7021 Olecranon bursitis, right elbow: Secondary | ICD-10-CM

## 2020-12-27 NOTE — Progress Notes (Signed)
Chief Complaint  Patient presents with  . Elbow Problem    R/ its been puffy and tight, but yesterday it felt more like it has fluid in it. Hoping that is all it is.   DOS was 84/33 /29   45 year old male had olecranon bursectomy October 08, 2020 developed puffiness and tightness and then some fluid  No signs of infection  Right elbow incision looks good no erythema range of motion is normal definite swollen bursa  He is okay with aspiration  Use 18-gauge needle and a 10 cc syringe  I aspirated 14 cc of fluid , it was orange-colored  I injected 6 mg of celestone   Ace wrap for 48 hrs   Fu prn

## 2020-12-27 NOTE — Patient Instructions (Signed)

## 2021-07-18 ENCOUNTER — Other Ambulatory Visit: Payer: Self-pay | Admitting: Urology

## 2021-07-18 DIAGNOSIS — N200 Calculus of kidney: Secondary | ICD-10-CM

## 2021-07-19 NOTE — Progress Notes (Signed)
Talked with patient. Hx and meds reviewed. Arrival time 0645 Clear liquids until 0445. Okay to take AM meds morning of procedure.Driver secured

## 2021-07-21 ENCOUNTER — Encounter (HOSPITAL_BASED_OUTPATIENT_CLINIC_OR_DEPARTMENT_OTHER): Payer: Self-pay | Admitting: Urology

## 2021-07-21 ENCOUNTER — Ambulatory Visit (HOSPITAL_BASED_OUTPATIENT_CLINIC_OR_DEPARTMENT_OTHER)
Admission: RE | Admit: 2021-07-21 | Discharge: 2021-07-21 | Disposition: A | Discharge status: Home / Self Care | Payer: 59 | Attending: Urology | Admitting: Urology

## 2021-07-21 ENCOUNTER — Other Ambulatory Visit: Payer: Self-pay

## 2021-07-21 ENCOUNTER — Ambulatory Visit (HOSPITAL_COMMUNITY): Payer: 59

## 2021-07-21 ENCOUNTER — Encounter (HOSPITAL_BASED_OUTPATIENT_CLINIC_OR_DEPARTMENT_OTHER)
Admission: RE | Disposition: A | Discharge status: Home / Self Care | Payer: Self-pay | Source: Home / Self Care | Attending: Urology

## 2021-07-21 DIAGNOSIS — Z6830 Body mass index (BMI) 30.0-30.9, adult: Secondary | ICD-10-CM | POA: Diagnosis not present

## 2021-07-21 DIAGNOSIS — I10 Essential (primary) hypertension: Secondary | ICD-10-CM | POA: Diagnosis not present

## 2021-07-21 DIAGNOSIS — E669 Obesity, unspecified: Secondary | ICD-10-CM | POA: Diagnosis not present

## 2021-07-21 DIAGNOSIS — F17200 Nicotine dependence, unspecified, uncomplicated: Secondary | ICD-10-CM | POA: Diagnosis not present

## 2021-07-21 DIAGNOSIS — N200 Calculus of kidney: Secondary | ICD-10-CM | POA: Insufficient documentation

## 2021-07-21 HISTORY — PX: EXTRACORPOREAL SHOCK WAVE LITHOTRIPSY: SHX1557

## 2021-07-21 SURGERY — LITHOTRIPSY, ESWL
Anesthesia: LOCAL | Laterality: Left

## 2021-07-21 MED ORDER — DIPHENHYDRAMINE HCL 25 MG PO CAPS
25.0000 mg | ORAL_CAPSULE | ORAL | Status: AC
  Administered 2021-07-21: 25 mg via ORAL

## 2021-07-21 MED ORDER — CIPROFLOXACIN HCL 500 MG PO TABS
500.0000 mg | ORAL_TABLET | ORAL | Status: AC
  Administered 2021-07-21: 500 mg via ORAL

## 2021-07-21 MED ORDER — DIPHENHYDRAMINE HCL 25 MG PO CAPS
ORAL_CAPSULE | ORAL | Status: AC
  Filled 2021-07-21: qty 1

## 2021-07-21 MED ORDER — SODIUM CHLORIDE 0.9 % IV SOLN: INTRAVENOUS | Status: DC

## 2021-07-21 MED ORDER — CIPROFLOXACIN HCL 500 MG PO TABS
ORAL_TABLET | ORAL | Status: AC
  Filled 2021-07-21: qty 1

## 2021-07-21 MED ORDER — DIAZEPAM 5 MG PO TABS
ORAL_TABLET | ORAL | Status: AC
  Filled 2021-07-21: qty 2

## 2021-07-21 MED ORDER — DIAZEPAM 5 MG PO TABS
10.0000 mg | ORAL_TABLET | ORAL | Status: AC
  Administered 2021-07-21: 10 mg via ORAL

## 2021-07-21 MED ORDER — HYDROCODONE-ACETAMINOPHEN 5-325 MG PO TABS: 1.0000 | ORAL_TABLET | ORAL | 0 refills | Status: AC | PRN

## 2021-07-21 MED ORDER — TAMSULOSIN HCL 0.4 MG PO CAPS: 0.4000 mg | ORAL_CAPSULE | Freq: Every day | ORAL | 1 refills | Status: AC

## 2021-07-21 NOTE — Op Note (Signed)
See [facility] OP note scanned into chart. Also because of the size, density, location and other factors that cannot be anticipated I feel this will likely be a staged procedure. This fact supersedes any indication in the scanned [facility] operative note to the contrary.  

## 2021-07-21 NOTE — H&P (Signed)
See scanned H&P

## 2021-07-22 ENCOUNTER — Encounter (HOSPITAL_BASED_OUTPATIENT_CLINIC_OR_DEPARTMENT_OTHER): Payer: Self-pay | Admitting: Urology

## 2022-12-03 IMAGING — RF DG ELBOW 2V*R*
1 series · 1 of 1 positions shown · non-contrast
Comparison: None.

CLINICAL DATA: Bursitis right elbow.  Spur removed.

FLUOROSCOPY TIME:  17 seconds.
Images: 1
EXAM:
RIGHT ELBOW - 2 VIEW

[Series 1: run · 1 of 1 slices shown]
[im 1/1]
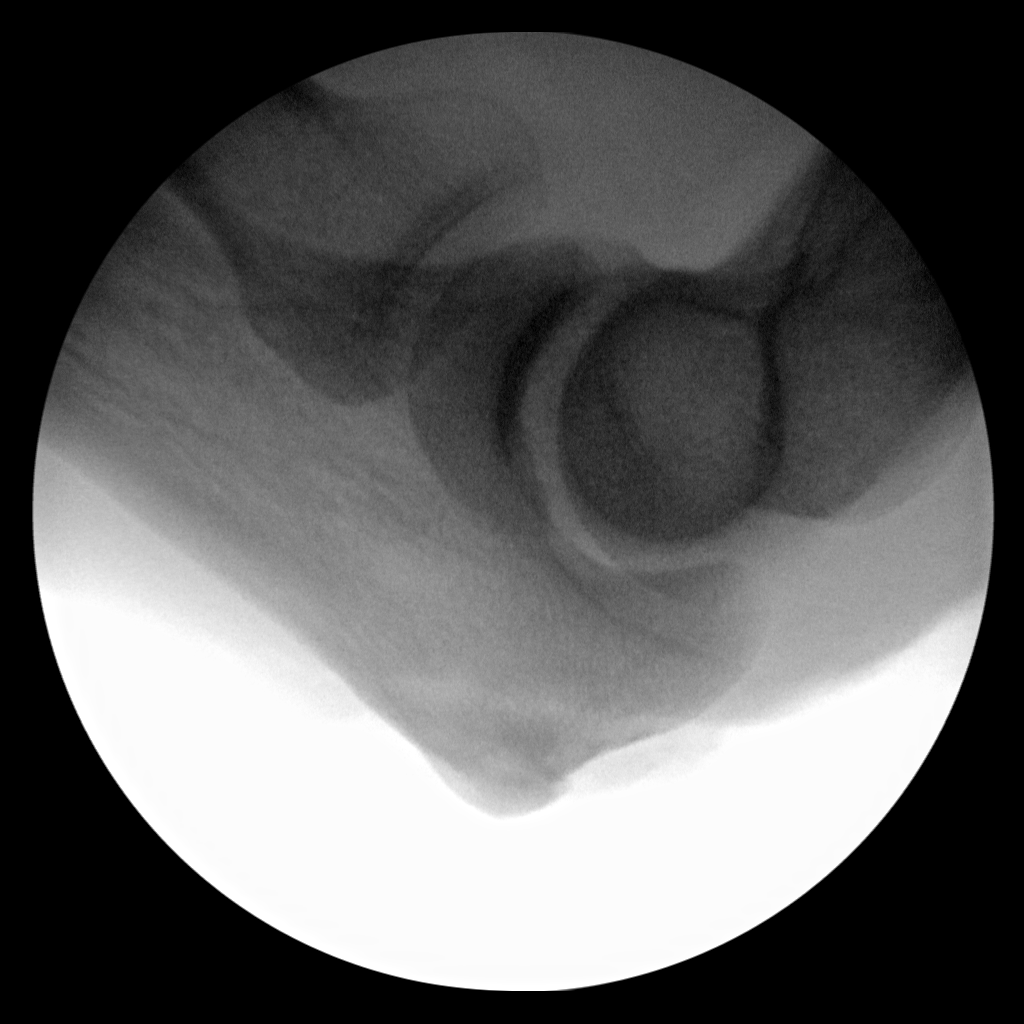

[1 of 1 positions shown; findings below may reference images not displayed]

FINDINGS: A single lateral view was obtained of the elbow. No acute
abnormalities noted.
IMPRESSION: A single lateral fluoroscopic view was obtained of the right elbow.
No acute abnormalities.

## 2022-12-03 IMAGING — RF DG C-ARM 1-60 MIN
1 series · 1 of 1 positions shown · non-contrast
Comparison: None.

CLINICAL DATA: Bursitis of right elbow.  Spur removed.

EXAM:
DG C-ARM 1-60 MIN
FLUOROSCOPY TIME:  Fluoroscopy Time:  17 seconds
Radiation Exposure Index (if provided by the fluoroscopic device):
0.85 mGy
Number of Acquired Spot Images: 1

[Series 1: run · 1 of 1 slices shown]
[im 1/1]
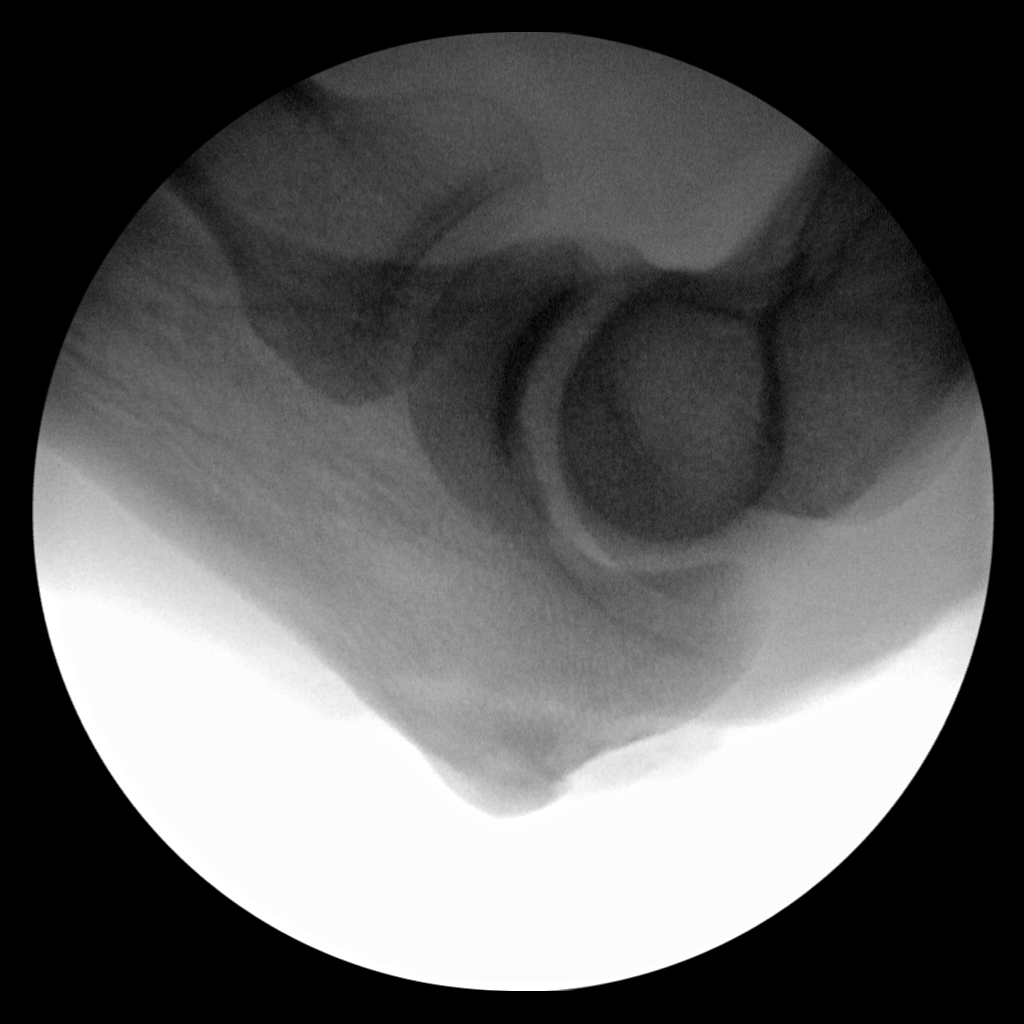

[1 of 1 positions shown; findings below may reference images not displayed]

FINDINGS: A single lateral view of the elbow was obtained. No foreign bodies
or hardware identified. No obvious fractures.
IMPRESSION: A single lateral view of the elbow was obtained as above.

## 2023-01-04 ENCOUNTER — Encounter: Payer: Self-pay | Admitting: Radiology
